# Patient Record
Sex: Male | Born: 1940 | Race: White | Hispanic: No | Marital: Married | Smoking: Former smoker
Health system: Southern US, Community
[De-identification: ages and names within clinical notes are randomized; demographics above are authoritative.]

## PROBLEM LIST (undated history)

## (undated) DIAGNOSIS — C61 Malignant neoplasm of prostate: Secondary | ICD-10-CM

## (undated) DIAGNOSIS — I219 Acute myocardial infarction, unspecified: Secondary | ICD-10-CM

## (undated) DIAGNOSIS — K219 Gastro-esophageal reflux disease without esophagitis: Secondary | ICD-10-CM

## (undated) DIAGNOSIS — I10 Essential (primary) hypertension: Secondary | ICD-10-CM

## (undated) DIAGNOSIS — I2510 Atherosclerotic heart disease of native coronary artery without angina pectoris: Secondary | ICD-10-CM

## (undated) DIAGNOSIS — G629 Polyneuropathy, unspecified: Secondary | ICD-10-CM

## (undated) DIAGNOSIS — E7800 Pure hypercholesterolemia, unspecified: Secondary | ICD-10-CM

## (undated) HISTORY — PX: CORONARY ARTERY BYPASS GRAFT: SHX141

## (undated) HISTORY — PX: CAROTID STENT: SHX1301

## (undated) HISTORY — PX: CATARACT EXTRACTION: SUR2

## (undated) HISTORY — DX: Malignant neoplasm of prostate: C61

## (undated) HISTORY — DX: Pure hypercholesterolemia, unspecified: E78.00

## (undated) HISTORY — DX: Essential (primary) hypertension: I10

## (undated) HISTORY — PX: UMBILICAL HERNIA REPAIR: SHX196

## (undated) HISTORY — PX: KNEE SURGERY: SHX244

## (undated) HISTORY — DX: Atherosclerotic heart disease of native coronary artery without angina pectoris: I25.10

## (undated) HISTORY — PX: CHOLECYSTECTOMY: SHX55

## (undated) HISTORY — PX: HERNIA REPAIR: SHX51

---

## 2004-01-17 ENCOUNTER — Other Ambulatory Visit: Payer: Self-pay

## 2004-05-21 ENCOUNTER — Emergency Department: Payer: Self-pay | Admitting: Emergency Medicine

## 2004-06-26 HISTORY — PX: KNEE SURGERY: SHX244

## 2004-07-01 ENCOUNTER — Ambulatory Visit: Payer: Self-pay | Admitting: Family Medicine

## 2004-07-19 ENCOUNTER — Ambulatory Visit: Payer: Self-pay | Admitting: Family Medicine

## 2004-07-27 ENCOUNTER — Ambulatory Visit: Payer: Self-pay | Admitting: Family Medicine

## 2005-01-13 ENCOUNTER — Ambulatory Visit: Payer: Self-pay | Admitting: Gastroenterology

## 2005-11-28 ENCOUNTER — Ambulatory Visit: Payer: Self-pay | Admitting: Family Medicine

## 2006-03-27 ENCOUNTER — Ambulatory Visit: Payer: Self-pay | Admitting: Family Medicine

## 2006-04-24 ENCOUNTER — Ambulatory Visit: Payer: Self-pay | Admitting: Family Medicine

## 2008-02-05 ENCOUNTER — Ambulatory Visit: Payer: Self-pay | Admitting: Gastroenterology

## 2008-02-09 ENCOUNTER — Other Ambulatory Visit: Payer: Self-pay

## 2008-02-10 ENCOUNTER — Inpatient Hospital Stay: Payer: Self-pay | Admitting: Internal Medicine

## 2008-02-27 ENCOUNTER — Ambulatory Visit: Payer: Self-pay | Admitting: Gastroenterology

## 2008-11-27 ENCOUNTER — Ambulatory Visit: Payer: Self-pay | Admitting: Family Medicine

## 2008-12-24 ENCOUNTER — Ambulatory Visit: Payer: Self-pay | Admitting: Family Medicine

## 2009-05-14 ENCOUNTER — Ambulatory Visit: Payer: Self-pay

## 2009-07-23 ENCOUNTER — Ambulatory Visit: Payer: Self-pay | Admitting: General Practice

## 2009-07-30 ENCOUNTER — Ambulatory Visit: Payer: Self-pay | Admitting: General Practice

## 2010-02-03 ENCOUNTER — Ambulatory Visit: Payer: Self-pay | Admitting: Urology

## 2010-04-20 ENCOUNTER — Encounter (HOSPITAL_COMMUNITY)
Admission: RE | Admit: 2010-04-20 | Discharge: 2010-06-24 | Payer: Self-pay | Source: Home / Self Care | Attending: Urology | Admitting: Urology

## 2010-08-30 ENCOUNTER — Ambulatory Visit: Payer: Self-pay | Admitting: Gastroenterology

## 2011-02-28 ENCOUNTER — Ambulatory Visit: Payer: Self-pay | Admitting: Cardiology

## 2011-03-07 ENCOUNTER — Inpatient Hospital Stay: Payer: Self-pay | Admitting: Internal Medicine

## 2011-04-26 ENCOUNTER — Ambulatory Visit: Payer: Self-pay | Admitting: Cardiology

## 2011-07-31 ENCOUNTER — Encounter: Payer: Self-pay | Admitting: Cardiology

## 2011-08-25 ENCOUNTER — Encounter: Payer: Self-pay | Admitting: Cardiology

## 2011-09-25 ENCOUNTER — Encounter: Payer: Self-pay | Admitting: Cardiology

## 2012-04-02 ENCOUNTER — Ambulatory Visit: Payer: Self-pay | Admitting: Urology

## 2012-06-14 ENCOUNTER — Ambulatory Visit: Payer: Self-pay | Admitting: Gastroenterology

## 2012-06-18 LAB — PATHOLOGY REPORT

## 2012-10-16 ENCOUNTER — Ambulatory Visit: Payer: Self-pay | Admitting: Urology

## 2012-10-17 ENCOUNTER — Ambulatory Visit: Payer: Self-pay | Admitting: Urology

## 2013-03-05 ENCOUNTER — Ambulatory Visit: Payer: Self-pay | Admitting: Ophthalmology

## 2013-03-07 ENCOUNTER — Ambulatory Visit: Payer: Self-pay | Admitting: Gastroenterology

## 2013-03-17 ENCOUNTER — Ambulatory Visit: Payer: Self-pay | Admitting: Ophthalmology

## 2013-06-09 ENCOUNTER — Ambulatory Visit: Payer: Self-pay | Admitting: Ophthalmology

## 2014-01-21 ENCOUNTER — Ambulatory Visit: Payer: Self-pay

## 2014-01-27 ENCOUNTER — Ambulatory Visit: Payer: Self-pay

## 2014-02-17 ENCOUNTER — Emergency Department: Payer: Self-pay | Admitting: Emergency Medicine

## 2014-03-21 ENCOUNTER — Emergency Department: Payer: Self-pay | Admitting: Student

## 2014-07-29 ENCOUNTER — Ambulatory Visit: Payer: Self-pay | Admitting: Internal Medicine

## 2014-08-07 ENCOUNTER — Ambulatory Visit: Payer: Self-pay | Admitting: Internal Medicine

## 2014-08-10 ENCOUNTER — Ambulatory Visit: Payer: Self-pay | Admitting: Radiation Oncology

## 2014-08-25 ENCOUNTER — Ambulatory Visit
Admit: 2014-08-25 | Disposition: A | Discharge status: Home / Self Care | Payer: Self-pay | Attending: Radiation Oncology | Admitting: Radiation Oncology

## 2014-09-23 LAB — CBC CANCER CENTER
BASOS PCT: 0.7 %
Basophil #: 0 x10 3/mm (ref 0.0–0.1)
EOS PCT: 3 %
Eosinophil #: 0.2 x10 3/mm (ref 0.0–0.7)
HCT: 39.2 % — ABNORMAL LOW (ref 40.0–52.0)
HGB: 13.2 g/dL (ref 13.0–18.0)
Lymphocyte #: 0.7 x10 3/mm — ABNORMAL LOW (ref 1.0–3.6)
Lymphocyte %: 12.1 %
MCH: 28.3 pg (ref 26.0–34.0)
MCHC: 33.6 g/dL (ref 32.0–36.0)
MCV: 84 fL (ref 80–100)
MONOS PCT: 10.7 %
Monocyte #: 0.6 x10 3/mm (ref 0.2–1.0)
NEUTROS PCT: 73.5 %
Neutrophil #: 3.9 x10 3/mm (ref 1.4–6.5)
PLATELETS: 153 x10 3/mm (ref 150–440)
RBC: 4.67 10*6/uL (ref 4.40–5.90)
RDW: 14.3 % (ref 11.5–14.5)
WBC: 5.4 x10 3/mm (ref 3.8–10.6)

## 2014-09-25 ENCOUNTER — Ambulatory Visit
Admit: 2014-09-25 | Disposition: A | Discharge status: Home / Self Care | Payer: Self-pay | Attending: Radiation Oncology | Admitting: Radiation Oncology

## 2014-10-16 NOTE — Op Note (Signed)
PATIENT NAME:  Derek Parker, Derek Parker MR#:  [id number] DATE OF BIRTH:  06/12/1941  DATE OF PROCEDURE:  06/09/2013  PREOPERATIVE DIAGNOSIS:  Cataract, right eye.   POSTOPERATIVE DIAGNOSIS:  Cataract, right eye.  PROCEDURE PERFORMED:  Extracapsular cataract extraction using phacoemulsification with placement of an Alcon SN6CWS, 17.5-diopter posterior chamber lens, serial [id number].  SURGEON:  Loura Back. Marquis Down, MD  ASSISTANT:  None.  ANESTHESIA:  4% lidocaine and 0.75% Marcaine in a 50/50 mixture with 10 units/mL of Hylenex added, given as a peribulbar.   ANESTHESIOLOGIST:  Dr. Myra Gianotti  LICATIONS:  None.  ESTIMATED BLOOD LOSS:  Less than 1 ml.  DESCRIPTION OF PROCEDURE:  The patient was brought to the operating room and given a peribulbar block.  The patient was then prepped and draped in the usual fashion.  The vertical rectus muscles were imbricated using 5-0 silk sutures.  These sutures were then clamped to the sterile drapes as bridle sutures.  A limbal peritomy was performed extending two clock hours and hemostasis was obtained with cautery.  A partial thickness scleral groove was made at the surgical limbus and dissected anteriorly in a lamellar dissection using an Alcon crescent knife.  The anterior chamber was entered superonasally with a Superblade and through the lamellar dissection with a 2.6 mm keratome.  DisCoVisc was used to replace the aqueous and a continuous tear capsulorrhexis was carried out.  Hydrodissection and hydrodelineation were carried out with balanced salt and a 27 gauge canula.  The nucleus was rotated to confirm the effectiveness of the hydrodissection.  Phacoemulsification was carried out using a divide-and-conquer technique.  Total ultrasound time was 1 minutes and average power of 20.9  percent and CDE of 23.26.  Irrigation/aspiration was used to remove the residual cortex.  DisCoVisc was used to inflate the capsule and the internal incision was enlarged to 3 mm  with the crescent knife.  The intraocular lens was folded and inserted into the capsular bag using the AcrySert delivery system.  Irrigation/aspiration was used to remove the residual DisCoVisc.  Miostat was injected into the anterior chamber through the paracentesis track to inflate the anterior chamber and induce miosis.  A tenth of a milliliter of cefuroxime was injected via the paracentesis tract containing 1 mg of drug. The wound was checked for leaks and none were found. The conjunctiva was closed with cautery and the bridle sutures were removed.  Two drops of 0.3% Vigamox were placed on the eye.   An eye shield was placed on the eye.  The patient was discharged to the recovery room in good condition. ____________________________ Loura Back Jaclene Bartelt, MD sad:sb D: 06/09/2013 12:40:33 ET T: 06/09/2013 13:26:13 ET JOB#: [id number]  cc: Remo Lipps A. Frans Valente, MD, <Dictator> Martie Lee MD ELECTRONICALLY SIGNED 06/23/2013 13:31

## 2014-10-16 NOTE — Op Note (Signed)
PATIENT NAME:  Derek Parker, Derek Parker MR#:  [id number] DATE OF BIRTH:  1941/02/02  DATE OF PROCEDURE:  03/17/2013  PREOPERATIVE DIAGNOSIS: Cataract, left eye.   POSTOPERATIVE DIAGNOSIS:  Cataract, left eye.    PROCEDURE PERFORMED: Extracapsular cataract extraction using phacoemulsification with placement Alcon SN6CWS, 18.0 diopter posterior chamber lens, serial number [id number].   SURGEON: Loura Back. Savier Trickett, M.D.   ANESTHESIA: Lidocaine 4% and 0.75% Marcaine 50-50 mixture with 10 units/mL of Hylenex added, given as a peribulbar.   ANESTHESIOLOGIST: Dr. Benjamine Mola.   COMPLICATIONS: None.   ESTIMATED BLOOD LOSS: Less than 1 mL.   DESCRIPTION OF PROCEDURE:  The patient was brought to the operating room and given a peribulbar block.  The patient was then prepped and draped in the usual fashion.  The vertical rectus muscles were imbricated using 5-0 silk sutures.  These sutures were then clamped to the sterile drapes as bridle sutures.  A limbal peritomy was performed extending 2 clock hours and hemostasis was obtained with cautery.  A partial thickness scleral groove was made at the surgical limbus and dissected anteriorly in a lamellar dissection using an Alcon crescent knife.  The anterior chamber was entered supero-temporally with a Superblade and through the lamellar dissection with a 2.6 mm keratome.  DisCoVisc was used to replace the aqueous and a continuous tear capsulorrhexis was carried out.  Hydrodissection and hydrodelineation were carried out with balanced salt and a 27-gauge canula.  The nucleus was rotated to confirm the effectiveness of the hydrodissection.  Phacoemulsification was carried out using a divide-and-conquer technique.  Total ultrasound time was 1 minute and 6 seconds with an average power of 22.4%, CDE of 26.75.  Irrigation/aspiration was used to remove the residual cortex.  DisCoVisc was used to inflate the capsule and the internal incision was enlarged to 3 mm with the  crescent knife.  The intraocular lens was folded and inserted into the capsular bag using the [employer].  Irrigation/aspiration was used to remove the residual DisCoVisc.  Miostat was injected into the anterior chamber through the paracentesis track to inflate the anterior chamber and induce miosis.  Cefuroxime 0.1 mL containing 1 mg of the drug was injected via the paracentesis track.  The wound was checked for leaks and wound leakage was found.  A single 10-0 suture was placed across the incision, tied and the knot was rotated superiorly.  The conjunctiva was closed with cautery and the bridle sutures were removed.  Two drops of 0.3% Vigamox were placed on the eye.   An eye shield was placed on the eye.  The patient was discharged to the recovery room in good condition.    ____________________________ Loura Back Sheetal Lyall, MD sad:cs D: 03/17/2013 13:13:42 ET T: 03/17/2013 14:03:06 ET JOB#: [id number]  cc: Remo Lipps A. Jelina Paulsen, MD, <Dictator> Martie Lee MD ELECTRONICALLY SIGNED 03/24/2013 13:20

## 2014-10-19 LAB — CBC CANCER CENTER
BASOS PCT: 0.8 %
Basophil #: 0 x10 3/mm (ref 0.0–0.1)
Eosinophil #: 0.2 x10 3/mm (ref 0.0–0.7)
Eosinophil %: 3 %
HCT: 40.9 % (ref 40.0–52.0)
HGB: 13.8 g/dL (ref 13.0–18.0)
LYMPHS ABS: 0.9 x10 3/mm — AB (ref 1.0–3.6)
Lymphocyte %: 15.3 %
MCH: 28.3 pg (ref 26.0–34.0)
MCHC: 33.7 g/dL (ref 32.0–36.0)
MCV: 84 fL (ref 80–100)
MONO ABS: 0.5 x10 3/mm (ref 0.2–1.0)
Monocyte %: 7.9 %
NEUTROS ABS: 4.3 x10 3/mm (ref 1.4–6.5)
NEUTROS PCT: 73 %
Platelet: 170 x10 3/mm (ref 150–440)
RBC: 4.88 10*6/uL (ref 4.40–5.90)
RDW: 14.9 % — AB (ref 11.5–14.5)
WBC: 5.8 x10 3/mm (ref 3.8–10.6)

## 2014-10-25 NOTE — Consult Note (Signed)
Reason for Visit: This 74 year old Male patient presents to the clinic for initial evaluation of  stage IV prostate cancer .   Referred by Dr. Elza Rafter [facility].  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 Metastatic workup was negative he underwent Lupron therapy along with concurrent radiation therapy to his prostate. In 2014 his PSA progressed and 94 PET scan with sodium fluoride demonstrated widespread osseous metastatic disease involving spine ribs and pelvis. Interestingly this was not seen on bone scan. He has been treated with Zytiga and prednisone as well as Xtandi,Xgeva. Recent PET CT scan showed again widespread axial metastasis with a new lesion in his left mid femoral shaft and he has been referred for palliative radiation therapy here. He is ambulating well. States he's not really having that much significant pain. He continues to work as a Presenter, broadcasting at [employer].  Past Hx:    prostate cancer metastatic to bone: will start XRT to L femur 07/2014   Hypercholesterolemia:    Cancer, Prostate:    CAD:    diabetic neuropathy:    Diabetes:    Hypertension:    CABG:    umbilical hernia repair:    Cholecystectomy:    Hernia Repair:    Knee Surgery - Left:    Carotid Stent Placement:   Past, Family and Social History:  Past Medical History positive   Cardiovascular  coronary artery disease; coronary artery stents; hyperlipidemia; hypertension   Endocrine diabetes mellitus; diabetic neuropathy   Past Surgical History cholecystectomy; left knee surgery, herniorrhaphy repair, umbilical hernia repair   Family History positive   Family History Comments father with prostate cancer, congestive heart failure   Social History positive   Social History Comments 40-pack-year smoking history has quit smoking, social EtOH use history   Additional Past Medical and Surgical History accompanied by his wife today   Allergies:   No Known Allergies:   Home Meds:  Home Medications: Medication Instructions Status  doxycycline hyclate 100 mg capsule 1 cap(s) orally 2 times a day x 10 days Active  Crestor 20 mg oral tablet 1 tab(s)  once a day (at bedtime)  Active  Plavix 75 mg oral tablet 1 tab(s)  once a day (at bedtime) Discontinue prior to surgery   Active  aspirin 81mg  1 tab(s)  once a day (at bedtime) Discontinue prior to surgery   Active  Niaspan ER 500 mg oral tablet, extended release 3 tab(s)  once a day (at bedtime)  Active  Calcium 600+D 600 mg-200 intl units oral tablet 1 tab(s) orally 2 times a day Active  multivitamin 1  orally once a day Active  Dexilant 60 mg oral delayed release capsule 1 cap(s) orally once a day Active  alfuzosin 10 mg oral tablet, extended release 1 tab(s) orally once a day Active  metoprolol tartrate 100 mg oral tablet 1.5 tab(s) orally once a day Active  venlafaxine extended release 75 mg  oral capsule, extended release 3 cap(s) orally 3 times a day Active  solifenacin 5 mg oral tablet 1 tab(s) orally once a day Active  acetaminophen-oxyCODONE 325 mg-5 mg oral tablet 1 tab(s) orally every 6 hours, As Needed Active  Effexor XR 75 mg oral capsule, extended release 3 cap(s) orally once a day Active   Review of Systems:  General negative   Performance Status (ECOG) 0   Skin negative   Breast negative   Ophthalmologic  negative   ENMT negative   Respiratory and Thorax negative   Cardiovascular negative   Gastrointestinal negative   Genitourinary see HPI   Musculoskeletal negative   Neurological negative   Psychiatric negative   Hematology/Lymphatics negative   Endocrine negative   Allergic/Immunologic negative   Review of Systems   denies any weight loss, fatigue, weakness, fever, chills or night sweats. Patient denies any loss of vision, blurred vision. Patient denies any ringing  of the ears or hearing loss. No irregular heartbeat. Patient denies heart murmur or history of fainting. Patient denies any chest pain or pain radiating to her upper extremities. Patient denies any shortness of breath, difficulty breathing at night, cough or hemoptysis. Patient denies any swelling in the lower legs. Patient denies any nausea vomiting, vomiting of blood, or coffee ground material in the vomitus. Patient denies any stomach pain. Patient states has had normal bowel movements no significant constipation or diarrhea. Patient denies any dysuria, hematuria or significant nocturia. Patient denies any problems walking, swelling in the joints or loss of balance. Patient denies any skin changes, loss of hair or loss of weight. Patient denies any excessive worrying or anxiety or significant depression. Patient denies any problems with insomnia. Patient denies excessive thirst, polyuria, polydipsia. Patient denies any swollen glands, patient denies easy bruising or easy bleeding. Patient denies any recent infections, allergies or URI. Patient "s visual fields have not changed significantly in recent time.   Nursing Notes:  Nursing Vital Signs and Chemo Nursing Nursing Notes: *CC Vital Signs Flowsheet:   15-Feb-16 09:14  Temp Temperature 95.8  Pulse Pulse 67  Respirations Respirations 18  SBP SBP 132  DBP DBP 69  Pain Scale (0-10)  0  Current Weight (kg) (kg) 105.9   Physical Exam:  General/Skin/HEENT:   General normal   Skin normal   Eyes normal   ENMT normal   Head and Neck normal   Additional PE well-developed slightly obese male in NAD. Lungs are clear to A&P cardiac examination shows regular rate and rhythm abdomen is benign. Motor sensory and DTR levels are equal and symmetric in the upper lower extremities. Deep palpation of the spine does not elicit pain. Range of motion of his lower extremities does not elicit pain. Deep palpation of his femoral shafts bilaterally does not elicit pain. Per perception is intact.   Breasts/Resp/CV/GI/GU:  Respiratory and Thorax normal   Cardiovascular normal   Gastrointestinal normal   Genitourinary normal   MS/Neuro/Psych/Lymph:  Musculoskeletal normal   Neurological normal   Lymphatics normal   Relevent Results:   Relevant Scans and Labs recent PET CT scan is reviewed   Assessment and Plan: Impression:   progressive widespread metastatic prostate cancer in castrate resistant adenocarcinoma in 74 year old male with new lesion in the left femoral shaft. Plan:   at this time to go ahead with palliative radiation therapy to his left femur all include his acetabulum and proximal femur as well as the lesion in his mid shaft entry to 3000 cGy  in 10 fractions. Risks and benefits of treatment including some skin reaction fatigue were discussed in detail. I've also discussed with [facility] startingXofigo treatment 6 injections of radium 223area risks and benefits of 6 injections including bone marrow suppression, nausea diarrhea peripheral edema and injection site reaction all were discussed in detail with the patient. Will begin preparation for radium 223 treatments after completion of his of his external beam radiation. Patient and his wife both seem to comprehend my treatment plan well.I have set up and ordered CT simulation of his left femur for later this week.  I would like to take this opportunity for allowing me to participate in the care of  your patient..  CC Referral:  cc: Dr. Elza Rafter [facility]   Fax to Physician:  Physicians To Recieve Fax: Woodfin Ganja, MD - [phone].  Electronic Signatures: Hollis Oh, Roda Shutters (MD)  (Signed 15-Feb-16 11:32)  Authored: HPI, Diagnosis, Past Hx, PFSH, Allergies, Home Meds, ROS, Nursing Notes, Physical Exam, Relevent Results, Encounter Assessment and Plan, CC Referring Physician, Fax to Physician   Last Updated: 15-Feb-16 11:32 by [name] (MD)

## 2014-10-26 ENCOUNTER — Ambulatory Visit: Payer: Self-pay | Admitting: Radiation Oncology

## 2014-10-28 ENCOUNTER — Ambulatory Visit
Admission: RE | Admit: 2014-10-28 | Discharge: 2014-10-28 | Disposition: A | Discharge status: Home / Self Care | Payer: Medicare Other | Source: Ambulatory Visit | Attending: Radiation Oncology | Admitting: Radiation Oncology

## 2014-10-28 ENCOUNTER — Encounter: Payer: Self-pay | Admitting: Radiation Oncology

## 2014-10-28 ENCOUNTER — Other Ambulatory Visit: Payer: Self-pay | Admitting: *Deleted

## 2014-10-28 VITALS — BP 152/80 | HR 68 | Temp 96.9°F | Resp 18 | Wt 231.7 lb

## 2014-10-28 DIAGNOSIS — Z510 Encounter for antineoplastic radiation therapy: Secondary | ICD-10-CM | POA: Diagnosis present

## 2014-10-28 DIAGNOSIS — C61 Malignant neoplasm of prostate: Secondary | ICD-10-CM | POA: Diagnosis not present

## 2014-10-28 DIAGNOSIS — C7951 Secondary malignant neoplasm of bone: Secondary | ICD-10-CM | POA: Diagnosis not present

## 2014-10-28 DIAGNOSIS — C7952 Secondary malignant neoplasm of bone marrow: Principal | ICD-10-CM

## 2014-10-28 NOTE — Evaluation (Signed)
Radiation Oncology Follow up Note  Name: Derek Parker   Date:   10/28/2014 MRN:  [id number] DOB: 25-Dec-1940    This 74 y.o. male presents to the clinic today for treatment follow up. And injection of Xofigo  REFERRING PROVIDER: No ref. provider found  HPI: patient is a 74 year old male treated back in 2002 for a Gleason 8 (4+4) adenocarcinoma the prostate presenting with a PSA of 8 status post Lupron therapy as well as IM RT radiation therapy. 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 He has been treated with his Zytiga , prednisone,Xtandi,Xgeva. He is seen today for Xofigo injection. Weight is stable patient has no specific complaints. Has had excellent palliative result from prior radiation therapy to his hip..  COMPLICATIONS OF TREATMENT: none  FOLLOW UP COMPLIANCE: keeps appointments   PHYSICAL EXAM:  BP 152/80 mmHg  Pulse 68  Temp(Src) 96.9 F (36.1 C)  Resp 18  Wt 231 lb 11.3 oz (105.1 kg) Well-developed well-nourished patient in NAD. HEENT reveals PERLA, EOMI, discs not visualized.  Oral cavity is clear. No oral mucosal lesions are identified. Neck is clear without evidence of cervical or supraclavicular adenopathy. Lungs are clear to A&P. Cardiac examination is essentially unremarkable with regular rate and rhythm without murmur rub or thrill. Abdomen is benign with no organomegaly or masses noted. Motor sensory and DTR levels are equal and symmetric in the upper and lower extremities. Cranial nerves II through XII are grossly intact. Proprioception is intact. No peripheral adenopathy or edema is identified. No motor or sensory levels are noted. Crude visual fields are within normal range.   RADIOLOGY RESULTS: No radiologic results to report  PLAN: Peripheral line was started on the patient and 20 cc of normal saline were passed to make sure there was patency of the lines. [name] was  administered over a 10 minute infusion push by radiation oncologist. After completion of IV push of Xofigo 30 cc an additional saline were passed through the peripheral line. Oral lines syringes drapes and original container of Xofigo were then taken to nuclear medicine for storage. Patient tolerated the procedure well without side effect or complaint. Patient has anti-emetic medication. Have scheduled the patient for a three-week followup to check on his counts. Patient is to call sooner with any side effects or complaints.     [name]., MD

## 2014-10-30 ENCOUNTER — Emergency Department: Payer: Medicare Other

## 2014-10-30 ENCOUNTER — Other Ambulatory Visit: Payer: Self-pay

## 2014-10-30 ENCOUNTER — Encounter: Payer: Self-pay | Admitting: *Deleted

## 2014-10-30 ENCOUNTER — Emergency Department
Admission: EM | Admit: 2014-10-30 | Discharge: 2014-10-30 | Disposition: A | Discharge status: Home / Self Care | Payer: Medicare Other | Attending: Emergency Medicine | Admitting: Emergency Medicine

## 2014-10-30 DIAGNOSIS — Z7902 Long term (current) use of antithrombotics/antiplatelets: Secondary | ICD-10-CM | POA: Diagnosis not present

## 2014-10-30 DIAGNOSIS — R079 Chest pain, unspecified: Secondary | ICD-10-CM | POA: Diagnosis present

## 2014-10-30 DIAGNOSIS — Z7982 Long term (current) use of aspirin: Secondary | ICD-10-CM | POA: Insufficient documentation

## 2014-10-30 DIAGNOSIS — R0789 Other chest pain: Secondary | ICD-10-CM | POA: Diagnosis not present

## 2014-10-30 DIAGNOSIS — Z79899 Other long term (current) drug therapy: Secondary | ICD-10-CM | POA: Diagnosis not present

## 2014-10-30 DIAGNOSIS — I10 Essential (primary) hypertension: Secondary | ICD-10-CM | POA: Diagnosis not present

## 2014-10-30 DIAGNOSIS — E119 Type 2 diabetes mellitus without complications: Secondary | ICD-10-CM | POA: Insufficient documentation

## 2014-10-30 DIAGNOSIS — Z87891 Personal history of nicotine dependence: Secondary | ICD-10-CM | POA: Diagnosis not present

## 2014-10-30 LAB — CBC
HCT: 37.5 % — ABNORMAL LOW (ref 40.0–52.0)
Hemoglobin: 12.4 g/dL — ABNORMAL LOW (ref 13.0–18.0)
MCH: 27.9 pg (ref 26.0–34.0)
MCHC: 33 g/dL (ref 32.0–36.0)
MCV: 84.5 fL (ref 80.0–100.0)
PLATELETS: 160 10*3/uL (ref 150–440)
RBC: 4.44 MIL/uL (ref 4.40–5.90)
RDW: 14.6 % — AB (ref 11.5–14.5)
WBC: 5.4 10*3/uL (ref 3.8–10.6)

## 2014-10-30 LAB — BASIC METABOLIC PANEL
ANION GAP: 9 (ref 5–15)
BUN: 28 mg/dL — AB (ref 6–20)
CHLORIDE: 108 mmol/L (ref 101–111)
CO2: 22 mmol/L (ref 22–32)
CREATININE: 1.19 mg/dL (ref 0.61–1.24)
Calcium: 9.1 mg/dL (ref 8.9–10.3)
GFR calc Af Amer: >60 mL/min (ref >60)
GFR calc non Af Amer: 59 mL/min — ABNORMAL LOW (ref >60)
GLUCOSE: 134 mg/dL — AB (ref 65–99)
Potassium: 4.1 mmol/L (ref 3.5–5.1)
Sodium: 139 mmol/L (ref 135–145)

## 2014-10-30 LAB — TROPONIN I

## 2014-10-30 MED ORDER — ASPIRIN 81 MG PO CHEW
CHEWABLE_TABLET | ORAL | Status: AC
  Administered 2014-10-30: 324 mg via ORAL
  Filled 2014-10-30: qty 4

## 2014-10-30 MED ORDER — ASPIRIN 81 MG PO CHEW
324.0000 mg | CHEWABLE_TABLET | Freq: Once | ORAL | Status: AC
  Administered 2014-10-30: 324 mg via ORAL

## 2014-10-30 NOTE — ED Provider Notes (Signed)
  Physical Exam  BP 121/69 mmHg  Pulse 69  Temp(Src) 98.3 F (36.8 C) (Oral)  Resp 19  SpO2 96%  Patient with chest pain presents today with nonspecific EKG changes. Per Dr. Reita Cliche she discussed with the patient who did not want to stay for chest pain observation. The patient has a follow point with his cardiologist, Dr.Paraschos, this Monday and second troponin is negative will be discharged. Physical Exam ----------------------------------------- 5:25 PM on 10/30/2014 -----------------------------------------   ED Course  Procedures  MDM Patient is resting comfortably without chest pain at this time. Says "I feel great."  We'll discharge to home.  Has follow-up appointment Monday morning with Dr. Saralyn Pilar, his cardiologist      Doran Stabler, MD 10/30/14 1726

## 2014-10-30 NOTE — ED Notes (Signed)
Pt experienced a sudden onset of left sided chest pain with dizziness today, pt rates pain 0 in traige

## 2014-10-30 NOTE — ED Notes (Signed)
Pt informed to return if life threatening symptoms occur.   

## 2014-10-30 NOTE — ED Provider Notes (Signed)
[facility] Emergency Department Provider Note   ____________________________________________  Time seen: On arrival  I have reviewed the triage vital signs and the nursing notes.   HISTORY  Chief Complaint Chest Pain    HPI Derek Parker is a 74 y.o. male who complains of left-sided chest pain. It started out just prior to arrival last about 30 minutes. Severity was mild to moderate. It did not feel like when he had a previous MI. He had no shortness of breath, nausea, or sweating. He has not been sick recently      Past Medical History  Diagnosis Date  . Prostate cancer     metastatic to bone  . Hypercholesteremia   . CAD (coronary artery disease)   . Neuropathy, diabetic   . Diabetes mellitus without complication   . HTN (hypertension)     There are no active problems to display for this patient.   Past Surgical History  Procedure Laterality Date  . Coronary artery bypass graft    . Umbilical hernia repair    . Cholecystectomy    . Knee surgery Left   . Carotid stent    . Knee surgery Left 2006    Current Outpatient Rx  Name  Route  Sig  Dispense  Refill  . alfuzosin (UROXATRAL) 10 MG 24 hr tablet   Oral   Take 1 tablet by mouth daily.         Marland Kitchen aspirin EC 81 MG tablet   Oral   Take 81 mg by mouth daily.         . Calcium Carb-Cholecalciferol (CALCIUM + D3) 600-200 MG-UNIT TABS   Oral   Take 1 tablet by mouth 2 (two) times daily before a meal.         . Cholecalciferol (VITAMIN D3) 2000 UNITS capsule   Oral   Take 2,000 Units by mouth daily.          . clopidogrel (PLAVIX) 75 MG tablet   Oral   Take 75 mg by mouth daily.         Marland Kitchen dexlansoprazole (DEXILANT) 60 MG capsule   Oral   Take 1 capsule by mouth daily.         . metoprolol (LOPRESSOR) 100 MG tablet   Oral   Take 150 mg by mouth daily.          . Multiple Vitamins-Minerals (MULTIVITAMIN ADULT PO)   Oral   Take 1 tablet by mouth daily.          . niacin (NIASPAN) 500 MG CR tablet   Oral   Take 1,500 mg by mouth at bedtime.         . Radium Ra 223 Dichloride (XOFIGO IV)   Intravenous   Inject 2 Tubes into the vein every 30 (thirty) days. Pt gets IV therapy every month on the 4th         . rosuvastatin (CRESTOR) 20 MG tablet   Oral   Take 20 mg by mouth daily.         . solifenacin (VESICARE) 5 MG tablet   Oral   Take 1 tablet by mouth daily.         Marland Kitchen venlafaxine XR (EFFEXOR-XR) 75 MG 24 hr capsule   Oral   Take 3 capsules by mouth daily.         Marland Kitchen abiraterone Acetate (ZYTIGA) 250 MG tablet   Oral   Take 250 mg by mouth.  Allergies No known allergies  Family History  Problem Relation Age of Onset  . Cancer Father     prostate    Social History History  Substance Use Topics  . Smoking status: Former Smoker -- 40 years  . Smokeless tobacco: Not on file  . Alcohol Use: 0.6 oz/week    1 Shots of liquor per week    Review of Systems  Constitutional: Negative for fever. Eyes: Negative for visual changes. ENT: Negative for sore throat. Cardiovascular: Negative for palpitations Respiratory: Negative for shortness of breath. Gastrointestinal: Negative for abdominal pain, vomiting and diarrhea. Genitourinary: Negative for dysuria. Musculoskeletal: Negative for back pain. Skin: Negative for rash. Neurological: Negative for headaches, focal weakness or numbness.   10-point ROS otherwise negative.  ____________________________________________   PHYSICAL EXAM:  VITAL SIGNS: ED Triage Vitals  Enc Vitals Group     BP 10/30/14 1122 83/54 mmHg     Pulse Rate 10/30/14 1122 62     Resp 10/30/14 1200 23     Temp 10/30/14 1122 98.3 F (36.8 C)     Temp Source 10/30/14 1122 Oral     SpO2 10/30/14 1122 98 %     Weight --      Height --      Head Cir --      Peak Flow --      Pain Score 10/30/14 1122 0     Pain Loc --      Pain Edu? --      Excl. in [place]? --       Constitutional: Alert and oriented. Well appearing and in no distress. Eyes: Conjunctivae are normal. PERRL. Normal extraocular movements. ENT   Head: Normocephalic and atraumatic.   Nose: No congestion/rhinnorhea.   Mouth/Throat: Mucous membranes are moist.   Neck: No stridor. Cardiovascular: Normal rate, regular rhythm.  No murmurs, rubs, or gallops. Respiratory: Normal respiratory effort without tachypnea nor retractions. Breath sounds are clear and equal bilaterally. No wheezes/rales/rhonchi. Gastrointestinal: Soft and nontender. No distention.  Genitourinary:  Musculoskeletal: Nontender with normal range of motion in all extremities. No joint effusions.  No lower extremity tenderness nor edema. Neurologic:  Normal speech and language. No gross focal neurologic deficits are appreciated. Speech is normal. No gait instability. Skin:  Skin is warm, dry and intact. No rash noted. Psychiatric: Mood and affect are normal. Speech and behavior are normal. Patient exhibits appropriate insight and judgment.  ____________________________________________    LABS (pertinent positives/negatives)  Troponin negative hemoglobin 35.4 metabolic panel pending  ____________________________________________   EKG  Normal sinus rhythm 64 bpm narrow QRS normal axis. T-wave inversions 3 and V1 through V4. This is a change from September 2014  EKG #2 repeat 4 hours later. Normal sinus rhythm 69 bpm narrow QRS normal axis T-wave inversions 3 V1 through V4. Unchanged from earlier today.  ____________________________________________    RADIOLOGY  Chest x-ray reviewed no edema or consolidation.  ____________________________________________   PROCEDURES  Procedure(s) performed: None  Critical Care performed: No  ____________________________________________   INITIAL IMPRESSION / ASSESSMENT AND PLAN / ED COURSE  Pertinent labs & imaging results that were available during  my care of the patient were reviewed by me and considered in my medical decision making (see chart for details).  The patient is currently chest pain free after an episode of nontoxic chest pain without associated symptoms. However he does have a history of a heart attack and an EKG which is changed from 2014. I will try to obtain an  EKG from cardiologist office. I discussed with the patient the option of four-hour repeat enzyme given this was very atypical chest pain and possibly going home versus hospitalization overnight. Patient wants to go home and understands the risks and return depressions. Patient care transferred to Dr. Dineen Kid 4 PM Appointment made with Dr. Lenn Cal for Monday 10:15am ____________________________________________   FINAL CLINICAL IMPRESSION(S) / ED DIAGNOSES  Acute chest pain nonspecific    Lisa Roca, MD 10/30/14 [phone]

## 2014-10-30 NOTE — Discharge Instructions (Signed)
We discussed her evaluation is reassuring in the emergency department. Return to ER for new or worsening chest pain, shortness of breath, trouble breathing, weakness, numbness, sweating episodes, or any other symptoms concerning to you. You need to see your cardiologist on Monday call for appointment.  Chest Pain (Nonspecific) It is often hard to give a specific diagnosis for the cause of chest pain. There is always a chance that your pain could be related to something serious, such as a heart attack or a blood clot in the lungs. You need to follow up with your health care provider for further evaluation. CAUSES   Heartburn.  Pneumonia or bronchitis.  Anxiety or stress.  Inflammation around your heart (pericarditis) or lung (pleuritis or pleurisy).  A blood clot in the lung.  A collapsed lung (pneumothorax). It can develop suddenly on its own (spontaneous pneumothorax) or from trauma to the chest.  Shingles infection (herpes zoster virus). The chest wall is composed of bones, muscles, and cartilage. Any of these can be the source of the pain.  The bones can be bruised by injury.  The muscles or cartilage can be strained by coughing or overwork.  The cartilage can be affected by inflammation and become sore (costochondritis). DIAGNOSIS  Lab tests or other studies may be needed to find the cause of your pain. Your health care provider may have you take a test called an ambulatory electrocardiogram (ECG). An ECG records your heartbeat patterns over a 24-hour period. You may also have other tests, such as:  Transthoracic echocardiogram (TTE). During echocardiography, sound waves are used to evaluate how blood flows through your heart.  Transesophageal echocardiogram (TEE).  Cardiac monitoring. This allows your health care provider to monitor your heart rate and rhythm in real time.  Holter monitor. This is a portable device that records your heartbeat and can help diagnose heart  arrhythmias. It allows your health care provider to track your heart activity for several days, if needed.  Stress tests by exercise or by giving medicine that makes the heart beat faster. TREATMENT   Treatment depends on what may be causing your chest pain. Treatment may include:  Acid blockers for heartburn.  Anti-inflammatory medicine.  Pain medicine for inflammatory conditions.  Antibiotics if an infection is present.  You may be advised to change lifestyle habits. This includes stopping smoking and avoiding alcohol, caffeine, and chocolate.  You may be advised to keep your head raised (elevated) when sleeping. This reduces the chance of acid going backward from your stomach into your esophagus. Most of the time, nonspecific chest pain will improve within 2-3 days with rest and mild pain medicine.  HOME CARE INSTRUCTIONS   If antibiotics were prescribed, take them as directed. Finish them even if you start to feel better.  For the next few days, avoid physical activities that bring on chest pain. Continue physical activities as directed.  Do not use any tobacco products, including cigarettes, chewing tobacco, or electronic cigarettes.  Avoid drinking alcohol.  Only take medicine as directed by your health care provider.  Follow your health care provider's suggestions for further testing if your chest pain does not go away.  Keep any follow-up appointments you made. If you do not go to an appointment, you could develop lasting (chronic) problems with pain. If there is any problem keeping an appointment, call to reschedule. SEEK MEDICAL CARE IF:   Your chest pain does not go away, even after treatment.  You have a rash with blisters  on your chest.  You have a fever. SEEK IMMEDIATE MEDICAL CARE IF:   You have increased chest pain or pain that spreads to your arm, neck, jaw, back, or abdomen.  You have shortness of breath.  You have an increasing cough, or you cough up  blood.  You have severe back or abdominal pain.  You feel nauseous or vomit.  You have severe weakness.  You faint.  You have chills. This is an emergency. Do not wait to see if the pain will go away. Get medical help at once. Call your local emergency services (911 in [place] Do not drive yourself to the hospital. MAKE SURE YOU:   Understand these instructions.  Will watch your condition.  Will get help right away if you are not doing well or get worse. Document Released: 03/22/2005 Document Revised: 06/17/2013 Document Reviewed: 01/16/2008 [facility] Patient Information 2015 [place], [place]. This information is not intended to replace advice given to you by your health care provider. Make sure you discuss any questions you have with your health care provider.

## 2014-11-16 ENCOUNTER — Inpatient Hospital Stay: Payer: Medicare Other | Attending: Radiation Oncology

## 2014-11-16 ENCOUNTER — Other Ambulatory Visit: Payer: Self-pay | Admitting: *Deleted

## 2014-11-16 DIAGNOSIS — C61 Malignant neoplasm of prostate: Secondary | ICD-10-CM | POA: Insufficient documentation

## 2014-11-16 DIAGNOSIS — C7951 Secondary malignant neoplasm of bone: Secondary | ICD-10-CM

## 2014-11-16 DIAGNOSIS — C7952 Secondary malignant neoplasm of bone marrow: Secondary | ICD-10-CM

## 2014-11-16 LAB — CBC
HCT: 39.4 % — ABNORMAL LOW (ref 40.0–52.0)
Hemoglobin: 13.4 g/dL (ref 13.0–18.0)
MCH: 28.6 pg (ref 26.0–34.0)
MCHC: 33.9 g/dL (ref 32.0–36.0)
MCV: 84.3 fL (ref 80.0–100.0)
PLATELETS: 187 10*3/uL (ref 150–440)
RBC: 4.68 MIL/uL (ref 4.40–5.90)
RDW: 14.7 % — AB (ref 11.5–14.5)
WBC: 5 10*3/uL (ref 3.8–10.6)

## 2014-11-16 LAB — DIFFERENTIAL
BASOS PCT: 1 %
Basophils Absolute: 0 10*3/uL (ref 0–0.1)
Eosinophils Absolute: 0.1 10*3/uL (ref 0–0.7)
Eosinophils Relative: 2 %
Lymphocytes Relative: 17 %
Lymphs Abs: 0.8 10*3/uL — ABNORMAL LOW (ref 1.0–3.6)
MONO ABS: 0.5 10*3/uL (ref 0.2–1.0)
Monocytes Relative: 10 %
Neutro Abs: 3.5 10*3/uL (ref 1.4–6.5)
Neutrophils Relative %: 70 %

## 2014-11-25 ENCOUNTER — Encounter: Payer: Self-pay | Admitting: Radiation Oncology

## 2014-11-25 ENCOUNTER — Ambulatory Visit
Admission: RE | Admit: 2014-11-25 | Discharge: 2014-11-25 | Disposition: A | Discharge status: Home / Self Care | Payer: Medicare Other | Source: Ambulatory Visit | Attending: Radiation Oncology | Admitting: Radiation Oncology

## 2014-11-25 ENCOUNTER — Other Ambulatory Visit: Payer: Self-pay | Admitting: *Deleted

## 2014-11-25 VITALS — BP 131/71 | HR 88 | Temp 97.4°F | Resp 20 | Ht 72.0 in | Wt 235.5 lb

## 2014-11-25 DIAGNOSIS — C7952 Secondary malignant neoplasm of bone marrow: Principal | ICD-10-CM

## 2014-11-25 DIAGNOSIS — Z510 Encounter for antineoplastic radiation therapy: Secondary | ICD-10-CM | POA: Diagnosis not present

## 2014-11-25 DIAGNOSIS — C7951 Secondary malignant neoplasm of bone: Secondary | ICD-10-CM

## 2014-11-25 DIAGNOSIS — C61 Malignant neoplasm of prostate: Secondary | ICD-10-CM

## 2014-11-25 NOTE — Progress Notes (Signed)
Radiation Oncology Procedure Note  Name: Derek Parker  MRN: [id number]  Date:   11/25/2014     DOB: 11-11-1940   This 74 y.o. male patient presents to the clinic for Derek Parker treatment.  CHIEF COMPLAINT:  Chief Complaint  Patient presents with  . Follow-up    Pt is here for xofigo #2    DIAGNOSIS: The encounter diagnosis was Malignant neoplasm of prostate.  HPI: Patient is a 74 year old male with known stage IV adenocarcinoma the prostate originally presenting with a Gleason 8 (4+4) and a PSA in the 8 range. He completed radiation therapy to his prostate in 2015 although soon presented with widespread bony metastasis. He has been treated with multiple systemic therapies including's Zytiga and prednisone as well as Xtandi,Xgeva. He is seen today for his second Xofigo treatment. He is done well since the first treatment. No side effects or complaints. PHYSICAL EXAM: Well-developed well-nourished patient in NAD. HEENT reveals PERLA, EOMI, discs not visualized.  Oral cavity is clear. No oral mucosal lesions are identified. Neck is clear without evidence of cervical or supraclavicular adenopathy. Lungs are clear to A&P. Cardiac examination is essentially unremarkable with regular rate and rhythm without murmur rub or thrill. Abdomen is benign with no organomegaly or masses noted. Motor sensory and DTR levels are equal and symmetric in the upper and lower extremities. Cranial nerves II through XII are grossly intact. Proprioception is intact. No peripheral adenopathy or edema is identified. No motor or sensory levels are noted. Crude visual fields are within normal range.   BP Readings from Last 1 Encounters:  10/30/14 143/83   Pulse Readings from Last 1 Encounters:  10/30/14 69   Wt Readings from Last 1 Encounters:  No data found for Wt     PROCEDURE: Peripheral line was started on the patient and 20 cc of normal saline were passed to make sure there was patency of the lines. [name] was  administered over a 10 minute infusion push by radiation oncologist. After completion of IV push of Xofigo 30 cc an additional saline were passed through the peripheral line. Oral lines syringes drapes and original container of Xofigo were then taken to nuclear medicine for storage. Patient tolerated the procedure well without side effect or complaint. Patient has anti-emetic medication. Have scheduled the patient for a three-week followup to check on his counts. Patient is to call sooner with any side effects or complaints.   PLAN: At the present time he continues to do well. Will see him back in 3 weeks to recheck his blood counts and schedule his next infusion. Lungs were clear to A&P after after completion of infusion. Patient knows to call sooner with any concerns.  I would like to take this opportunity for allowing me to participate in the care of your patient.[name]., MD

## 2014-11-25 NOTE — Addendum Note (Signed)
Encounter addended by: Noreene Filbert, MD on: 11/25/2014 11:31 AM<BR>     Documentation filed: Follow-up Section, LOS Section, Clinical Notes

## 2014-11-25 NOTE — Addendum Note (Signed)
Encounter addended by: Daiva Huge, RN on: 11/25/2014 10:00 AM<BR>     Documentation filed: Lines[employer]Drains[employer] Education officer, museum

## 2014-12-21 ENCOUNTER — Inpatient Hospital Stay: Payer: Medicare Other | Attending: Radiation Oncology

## 2014-12-22 ENCOUNTER — Telehealth: Payer: Self-pay | Admitting: *Deleted

## 2014-12-22 NOTE — Telephone Encounter (Signed)
Spoke with wife regarding the change in appointment times for Lab and injection.  Lab 01-11-15 @ 945am and Injection 01-20-15 @ 1100am.

## 2014-12-30 ENCOUNTER — Ambulatory Visit: Payer: Medicare Other | Admitting: Radiation Oncology

## 2015-01-11 ENCOUNTER — Inpatient Hospital Stay: Payer: Medicare Other | Attending: Radiation Oncology

## 2015-01-11 DIAGNOSIS — C61 Malignant neoplasm of prostate: Secondary | ICD-10-CM | POA: Diagnosis not present

## 2015-01-11 DIAGNOSIS — C7951 Secondary malignant neoplasm of bone: Secondary | ICD-10-CM | POA: Diagnosis not present

## 2015-01-11 DIAGNOSIS — C7952 Secondary malignant neoplasm of bone marrow: Secondary | ICD-10-CM

## 2015-01-11 LAB — CBC WITH DIFFERENTIAL/PLATELET
BASOS PCT: 1 %
Basophils Absolute: 0.1 10*3/uL (ref 0–0.1)
Eosinophils Absolute: 0.2 10*3/uL (ref 0–0.7)
Eosinophils Relative: 2 %
HCT: 39.8 % — ABNORMAL LOW (ref 40.0–52.0)
Hemoglobin: 13.3 g/dL (ref 13.0–18.0)
Lymphocytes Relative: 13 %
Lymphs Abs: 0.8 10*3/uL — ABNORMAL LOW (ref 1.0–3.6)
MCH: 28.1 pg (ref 26.0–34.0)
MCHC: 33.4 g/dL (ref 32.0–36.0)
MCV: 84.2 fL (ref 80.0–100.0)
MONO ABS: 0.6 10*3/uL (ref 0.2–1.0)
MONOS PCT: 9 %
NEUTROS PCT: 75 %
Neutro Abs: 4.8 10*3/uL (ref 1.4–6.5)
PLATELETS: 173 10*3/uL (ref 150–440)
RBC: 4.73 MIL/uL (ref 4.40–5.90)
RDW: 14.5 % (ref 11.5–14.5)
WBC: 6.4 10*3/uL (ref 3.8–10.6)

## 2015-01-12 DIAGNOSIS — Z510 Encounter for antineoplastic radiation therapy: Secondary | ICD-10-CM | POA: Diagnosis not present

## 2015-01-20 ENCOUNTER — Ambulatory Visit
Admission: RE | Admit: 2015-01-20 | Discharge: 2015-01-20 | Disposition: A | Discharge status: Home / Self Care | Payer: Medicare Other | Source: Ambulatory Visit | Attending: Radiation Oncology | Admitting: Radiation Oncology

## 2015-01-20 ENCOUNTER — Encounter: Payer: Self-pay | Admitting: Radiation Oncology

## 2015-01-20 ENCOUNTER — Other Ambulatory Visit: Payer: Self-pay | Admitting: *Deleted

## 2015-01-20 VITALS — BP 151/85 | HR 78 | Temp 96.5°F | Resp 18 | Wt 230.4 lb

## 2015-01-20 DIAGNOSIS — C61 Malignant neoplasm of prostate: Secondary | ICD-10-CM

## 2015-01-20 DIAGNOSIS — Z510 Encounter for antineoplastic radiation therapy: Secondary | ICD-10-CM | POA: Diagnosis not present

## 2015-01-20 DIAGNOSIS — C7951 Secondary malignant neoplasm of bone: Secondary | ICD-10-CM | POA: Diagnosis not present

## 2015-01-20 NOTE — Progress Notes (Signed)
Radiation Oncology Procedure Note  Name: Derek Parker  MRN: [id number]  Date:   01/20/2015     DOB: 06-13-1941   This 74 y.o. male patient presents to the clinic for thirdXofigo injection.  CHIEF COMPLAINT:  Chief Complaint  Patient presents with  . Follow-up    Pt is here for xofigo #3    DIAGNOSIS: The encounter diagnosis was Malignant neoplasm of prostate metastatic to bone.  HPI: This 74 y.o. male patient presents to the clinic forPatient is a 74 year old male with known stage IV adenocarcinoma the prostate originally presenting with a Gleason 8 (4+4) and a PSA in the 8 range. He completed radiation therapy to his prostate in 2015 although soon presented with widespread bony metastasis. He has been treated with multiple systemic therapies including's Zytiga and prednisone as well as Xtandi,Xgeva. He is seen today for his ThirdXofigo treatment. He is done well since the first treatment. No side effects or complaints.   PHYSICAL EXAM: Well-developed well-nourished patient in NAD. HEENT reveals PERLA, EOMI, discs not visualized.  Oral cavity is clear. No oral mucosal lesions are identified. Neck is clear without evidence of cervical or supraclavicular adenopathy. Lungs are clear to A&P. Cardiac examination is essentially unremarkable with regular rate and rhythm without murmur rub or thrill. Abdomen is benign with no organomegaly or masses noted. Motor sensory and DTR levels are equal and symmetric in the upper and lower extremities. Cranial nerves II through XII are grossly intact. Proprioception is intact. No peripheral adenopathy or edema is identified. No motor or sensory levels are noted. Crude visual fields are within normal range.   BP Readings from Last 1 Encounters:  01/20/15 151/85   Pulse Readings from Last 1 Encounters:  01/20/15 78   Wt Readings from Last 1 Encounters:  01/20/15 230 lb 6.1 oz (104.5 kg)     PROCEDURE: Peripheral line was started on the patient  and 20 cc of normal saline were passed to make sure there was patency of the lines. [name] was administered over a 10 minute infusion push by radiation oncologist. After completion of IV push of Xofigo 30 cc an additional saline were passed through the peripheral line. Oral lines syringes drapes and original container of Xofigo were then taken to nuclear medicine for storage. Patient tolerated the procedure well without side effect or complaint. Patient has anti-emetic medication. Have scheduled the patient for a three-week followup to check on his counts. Patient is to call sooner with any side effects or complaints.   PLAN: as above. Patient is very scheduled for follow-up blood work as well as returning for his fourth injection. He knows to call with any concerns at any time.  [name]., MD

## 2015-02-08 ENCOUNTER — Encounter (INDEPENDENT_AMBULATORY_CARE_PROVIDER_SITE_OTHER): Payer: Self-pay

## 2015-02-08 ENCOUNTER — Inpatient Hospital Stay: Payer: Medicare Other | Attending: Radiation Oncology

## 2015-02-08 DIAGNOSIS — C7951 Secondary malignant neoplasm of bone: Secondary | ICD-10-CM | POA: Diagnosis not present

## 2015-02-08 DIAGNOSIS — C61 Malignant neoplasm of prostate: Secondary | ICD-10-CM | POA: Diagnosis not present

## 2015-02-08 LAB — CBC WITH DIFFERENTIAL/PLATELET
Basophils Absolute: 0 10*3/uL (ref 0–0.1)
Basophils Relative: 1 %
EOS ABS: 0.3 10*3/uL (ref 0–0.7)
Eosinophils Relative: 7 %
HEMATOCRIT: 37.2 % — AB (ref 40.0–52.0)
HEMOGLOBIN: 12.6 g/dL — AB (ref 13.0–18.0)
LYMPHS ABS: 0.7 10*3/uL — AB (ref 1.0–3.6)
LYMPHS PCT: 18 %
MCH: 28.6 pg (ref 26.0–34.0)
MCHC: 33.8 g/dL (ref 32.0–36.0)
MCV: 84.6 fL (ref 80.0–100.0)
Monocytes Absolute: 0.4 10*3/uL (ref 0.2–1.0)
Monocytes Relative: 11 %
NEUTROS PCT: 63 %
Neutro Abs: 2.5 10*3/uL (ref 1.4–6.5)
Platelets: 159 10*3/uL (ref 150–440)
RBC: 4.4 MIL/uL (ref 4.40–5.90)
RDW: 14.7 % — ABNORMAL HIGH (ref 11.5–14.5)
WBC: 3.9 10*3/uL (ref 3.8–10.6)

## 2015-02-09 DIAGNOSIS — Z510 Encounter for antineoplastic radiation therapy: Secondary | ICD-10-CM | POA: Diagnosis not present

## 2015-02-17 ENCOUNTER — Encounter: Payer: Self-pay | Admitting: Radiation Oncology

## 2015-02-17 ENCOUNTER — Ambulatory Visit
Admission: RE | Admit: 2015-02-17 | Discharge: 2015-02-17 | Disposition: A | Discharge status: Home / Self Care | Payer: Medicare Other | Source: Ambulatory Visit | Attending: Radiation Oncology | Admitting: Radiation Oncology

## 2015-02-17 ENCOUNTER — Other Ambulatory Visit: Payer: Self-pay | Admitting: *Deleted

## 2015-02-17 VITALS — BP 153/83 | HR 76 | Temp 98.0°F | Resp 18 | Wt 228.8 lb

## 2015-02-17 DIAGNOSIS — C7951 Secondary malignant neoplasm of bone: Secondary | ICD-10-CM

## 2015-02-17 DIAGNOSIS — C61 Malignant neoplasm of prostate: Secondary | ICD-10-CM

## 2015-02-17 NOTE — Progress Notes (Signed)
Radiation Oncology Procedure Note  Name: Derek Parker  MRN: [id number]  Date:   02/17/2015     DOB: 05-01-1941   This 74 y.o. male patient presents to the clinic for fourth injection of Xofigo for stage IV prostate cancer  CHIEF COMPLAINT:  Chief Complaint  Patient presents with  . Follow-up    #4 xofigo    DIAGNOSIS: The primary encounter diagnosis was Malignant neoplasm of prostate. A diagnosis of Prostate cancer metastatic to bone was also pertinent to this visit.  HPI: This 74 y.o. male patient presents to the clinic forPatient is a 74 year old male with known stage IV adenocarcinoma the prostate originally presenting with a Gleason 8 (4+4) and a PSA in the 8 range. He completed radiation therapy to his prostate in 2015 although soon presented with widespread bony metastasis. He has been treated with multiple systemic therapies including's Zytiga and prednisone as well as Xtandi,Xgeva. He is seen today for his fourth  treatment. He is done well since the first treatment. No side effects or complaints.  PHYSICAL EXAM: Well-developed well-nourished patient in NAD. HEENT reveals PERLA, EOMI, discs not visualized.  Oral cavity is clear. No oral mucosal lesions are identified. Neck is clear without evidence of cervical or supraclavicular adenopathy. Lungs are clear to A&P. Cardiac examination is essentially unremarkable with regular rate and rhythm without murmur rub or thrill. Abdomen is benign with no organomegaly or masses noted. Motor sensory and DTR levels are equal and symmetric in the upper and lower extremities. Cranial nerves II through XII are grossly intact. Proprioception is intact. No peripheral adenopathy or edema is identified. No motor or sensory levels are noted. Crude visual fields are within normal range.   BP Readings from Last 1 Encounters:  02/17/15 153/83   Pulse Readings from Last 1 Encounters:  02/17/15 76   Wt Readings from Last 1 Encounters:  02/17/15 228  lb 13.4 oz (103.8 kg)     PROCEDURE: Peripheral line was started on the patient and 20 cc of normal saline were passed to make sure there was patency of the lines. Derek Parker was administered over a 10 minute infusion push by radiation oncologist. After completion of IV push of Xofigo 30 cc an additional saline were passed through the peripheral line. Oral lines syringes drapes and original container of Xofigo were then taken to nuclear medicine for storage. Patient tolerated the procedure well without side effect or complaint. Patient has anti-emetic medication. Have scheduled the patient for a three-week followup to check on his counts. Patient is to call sooner with any side effects or complaints.   PLAN: Patient continues to do well with no side effects or complaints from his radium treatments. I'm please was overall progress. He scheduled for another month for blood work and his fifthXofigo treatment. Patient is to call with any concerns.  I would like to take this opportunity for allowing me to participate in the care of your patient.Derek Parker., MD

## 2015-02-18 DIAGNOSIS — Z510 Encounter for antineoplastic radiation therapy: Secondary | ICD-10-CM | POA: Diagnosis not present

## 2015-03-08 ENCOUNTER — Inpatient Hospital Stay: Payer: Medicare Other | Attending: Radiation Oncology

## 2015-03-08 ENCOUNTER — Encounter: Payer: Self-pay | Admitting: *Deleted

## 2015-03-08 DIAGNOSIS — C61 Malignant neoplasm of prostate: Secondary | ICD-10-CM

## 2015-03-08 DIAGNOSIS — C7951 Secondary malignant neoplasm of bone: Secondary | ICD-10-CM | POA: Insufficient documentation

## 2015-03-08 DIAGNOSIS — Z510 Encounter for antineoplastic radiation therapy: Secondary | ICD-10-CM | POA: Diagnosis not present

## 2015-03-08 LAB — CBC WITH DIFFERENTIAL/PLATELET
BASOS PCT: 1 %
Basophils Absolute: 0 10*3/uL (ref 0–0.1)
EOS ABS: 0.2 10*3/uL (ref 0–0.7)
Eosinophils Relative: 5 %
HEMATOCRIT: 37.4 % — AB (ref 40.0–52.0)
Hemoglobin: 12.7 g/dL — ABNORMAL LOW (ref 13.0–18.0)
Lymphocytes Relative: 14 %
Lymphs Abs: 0.7 10*3/uL — ABNORMAL LOW (ref 1.0–3.6)
MCH: 28.5 pg (ref 26.0–34.0)
MCHC: 33.8 g/dL (ref 32.0–36.0)
MCV: 84.4 fL (ref 80.0–100.0)
MONOS PCT: 10 %
Monocytes Absolute: 0.5 10*3/uL (ref 0.2–1.0)
NEUTROS ABS: 3.4 10*3/uL (ref 1.4–6.5)
Neutrophils Relative %: 70 %
Platelets: 182 10*3/uL (ref 150–440)
RBC: 4.44 MIL/uL (ref 4.40–5.90)
RDW: 14.4 % (ref 11.5–14.5)
WBC: 4.8 10*3/uL (ref 3.8–10.6)

## 2015-03-17 ENCOUNTER — Encounter: Payer: Self-pay | Admitting: Radiation Oncology

## 2015-03-17 ENCOUNTER — Other Ambulatory Visit: Payer: Self-pay | Admitting: *Deleted

## 2015-03-17 ENCOUNTER — Ambulatory Visit
Admission: RE | Admit: 2015-03-17 | Discharge: 2015-03-17 | Disposition: A | Discharge status: Home / Self Care | Payer: Medicare Other | Source: Ambulatory Visit | Attending: Radiation Oncology | Admitting: Radiation Oncology

## 2015-03-17 ENCOUNTER — Ambulatory Visit: Payer: Medicare Other | Admitting: Radiation Oncology

## 2015-03-17 VITALS — BP 175/86 | HR 68 | Resp 18 | Wt 237.0 lb

## 2015-03-17 DIAGNOSIS — C61 Malignant neoplasm of prostate: Secondary | ICD-10-CM

## 2015-03-17 DIAGNOSIS — Z510 Encounter for antineoplastic radiation therapy: Secondary | ICD-10-CM | POA: Diagnosis not present

## 2015-03-17 DIAGNOSIS — C7951 Secondary malignant neoplasm of bone: Principal | ICD-10-CM

## 2015-03-17 NOTE — Progress Notes (Signed)
Radiation Oncology Follow up Note  Name: Derek Parker   Date:   03/17/2015 MRN:  [id number] DOB: 02/15/1941    This 74 y.o. male presents to the clinic today for fifth Xofigo injection.  REFERRING PROVIDER: Juluis Pitch, MD  HPI: Patient is a 74 year old male with known stage IV adenocarcinoma the prostate originally Gleason 8 (4+4) presenting the PSA in the 8 range. He is now seen for his fifthXofigo injection. He is doing well bone pain is under good control he is scheduled for left knee surgery in the near future. That is the only site of pain he is having. His PSAs continue to be in around the 180 range..  COMPLICATIONS OF TREATMENT: none  FOLLOW UP COMPLIANCE: keeps appointments   PHYSICAL EXAM:  BP 175/86 mmHg  Pulse 68  Resp 18  Wt 236 lb 15.9 oz (107.5 kg) Well-developed well-nourished patient in NAD. HEENT reveals PERLA, EOMI, discs not visualized.  Oral cavity is clear. No oral mucosal lesions are identified. Neck is clear without evidence of cervical or supraclavicular adenopathy. Lungs are clear to A&P. Cardiac examination is essentially unremarkable with regular rate and rhythm without murmur rub or thrill. Abdomen is benign with no organomegaly or masses noted. Motor sensory and DTR levels are equal and symmetric in the upper and lower extremities. Cranial nerves II through XII are grossly intact. Proprioception is intact. No peripheral adenopathy or edema is identified. No motor or sensory levels are noted. Crude visual fields are within normal range.   RADIOLOGY RESULTS: No current films for review  Procedure: Peripheral line was started on the patient and 20 cc of normal saline were passed to make sure there was patency of the lines. [name] was administered over a 10 minute infusion push by radiation oncologist. After completion of IV push of Xofigo 30 cc an additional saline were passed through the peripheral line. Oral lines syringes drapes and original  container of Xofigo were then taken to nuclear medicine for storage. Patient tolerated the procedure well without side effect or complaint. Patient has anti-emetic medication. Have scheduled the patient for a three-week followup to check on his counts. Patient is to call sooner with any side effects or complaints.  As above patient is a rescheduled for his follow-up for blood work and his sixth and last injection. He is tolerated strips extremely well. Patient knows to call with any concerns.  I would like to take this opportunity for allowing me to participate in the care of your patient.[name]., MD

## 2015-04-05 ENCOUNTER — Inpatient Hospital Stay: Payer: Medicare Other | Attending: Radiation Oncology

## 2015-04-05 DIAGNOSIS — C61 Malignant neoplasm of prostate: Secondary | ICD-10-CM | POA: Diagnosis not present

## 2015-04-05 DIAGNOSIS — C7951 Secondary malignant neoplasm of bone: Secondary | ICD-10-CM | POA: Diagnosis not present

## 2015-04-05 DIAGNOSIS — Z510 Encounter for antineoplastic radiation therapy: Secondary | ICD-10-CM | POA: Diagnosis not present

## 2015-04-05 LAB — CBC WITH DIFFERENTIAL/PLATELET
BASOS ABS: 0 10*3/uL (ref 0–0.1)
Basophils Relative: 1 %
EOS ABS: 0.2 10*3/uL (ref 0–0.7)
Eosinophils Relative: 4 %
HCT: 34.8 % — ABNORMAL LOW (ref 40.0–52.0)
Hemoglobin: 11.9 g/dL — ABNORMAL LOW (ref 13.0–18.0)
Lymphocytes Relative: 13 %
Lymphs Abs: 0.6 10*3/uL — ABNORMAL LOW (ref 1.0–3.6)
MCH: 28.6 pg (ref 26.0–34.0)
MCHC: 34 g/dL (ref 32.0–36.0)
MCV: 84.1 fL (ref 80.0–100.0)
Monocytes Absolute: 0.5 10*3/uL (ref 0.2–1.0)
Monocytes Relative: 12 %
Neutro Abs: 3 10*3/uL (ref 1.4–6.5)
Neutrophils Relative %: 70 %
PLATELETS: 149 10*3/uL — AB (ref 150–440)
RBC: 4.14 MIL/uL — ABNORMAL LOW (ref 4.40–5.90)
RDW: 14.5 % (ref 11.5–14.5)
WBC: 4.3 10*3/uL (ref 3.8–10.6)

## 2015-04-06 DIAGNOSIS — Z510 Encounter for antineoplastic radiation therapy: Secondary | ICD-10-CM | POA: Diagnosis not present

## 2015-04-14 ENCOUNTER — Encounter: Payer: Self-pay | Admitting: Radiation Oncology

## 2015-04-14 ENCOUNTER — Ambulatory Visit
Admission: RE | Admit: 2015-04-14 | Discharge: 2015-04-14 | Disposition: A | Discharge status: Home / Self Care | Payer: Medicare Other | Source: Ambulatory Visit | Attending: Radiation Oncology | Admitting: Radiation Oncology

## 2015-04-14 VITALS — BP 133/71 | HR 68 | Temp 95.8°F | Resp 18 | Wt 231.5 lb

## 2015-04-14 DIAGNOSIS — C7951 Secondary malignant neoplasm of bone: Principal | ICD-10-CM

## 2015-04-14 DIAGNOSIS — Z510 Encounter for antineoplastic radiation therapy: Secondary | ICD-10-CM | POA: Diagnosis not present

## 2015-04-14 DIAGNOSIS — C61 Malignant neoplasm of prostate: Secondary | ICD-10-CM

## 2015-04-14 NOTE — Progress Notes (Signed)
Radiation Oncology Follow up Note  Name: Derek Parker   Date:   04/14/2015 MRN:  [id number] DOB: December 09, 1940    This 74 y.o. male presents to the clinic today for sixth and last Xofigo injection  REFERRING PROVIDER: Juluis Pitch, MD  HPI: Patient is a 74 year old male seen today for his last 68 out of 6 Xofigo injection. He is doing well. 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 He is doing well pain is under good control. Blood parameters are within normal limits..  COMPLICATIONS OF TREATMENT: none  FOLLOW UP COMPLIANCE: keeps appointments   PHYSICAL EXAM:  BP 133/71 mmHg  Pulse 68  Temp(Src) 95.8 F (35.4 C)  Resp 18  Wt 231 lb 7.7 oz (105 kg) Well-developed well-nourished patient in NAD. HEENT reveals PERLA, EOMI, discs not visualized.  Oral cavity is clear. No oral mucosal lesions are identified. Neck is clear without evidence of cervical or supraclavicular adenopathy. Lungs are clear to A&P. Cardiac examination is essentially unremarkable with regular rate and rhythm without murmur rub or thrill. Abdomen is benign with no organomegaly or masses noted. Motor sensory and DTR levels are equal and symmetric in the upper and lower extremities. Cranial nerves II through XII are grossly intact. Proprioception is intact. No peripheral adenopathy or edema is identified. No motor or sensory levels are noted. Crude visual fields are within normal range.  RADIOLOGY RESULTS: No current films for review  PLAN: Peripheral line was started on the patient and 20 cc of normal saline were passed to make sure there was patency of the lines. [name] was administered over a 10 minute infusion push by radiation oncologist. After completion of IV push of Xofigo 30 cc an additional saline were passed through the peripheral line. Oral lines syringes drapes and original container of Xofigo were then taken to nuclear medicine for storage.  Patient tolerated the procedure well without side effect or complaint. Patient has anti-emetic medication. Patient received his lastXofigo injection today. Tolerated extremely well as described above. We'll see him back in 1 month for follow-up. Patient knows to call sooner with any concerns.  I would like to take this opportunity for allowing me to participate in the care of your patient.[name]., MD

## 2015-05-12 ENCOUNTER — Encounter
Admission: RE | Admit: 2015-05-12 | Discharge: 2015-05-12 | Disposition: A | Discharge status: Home / Self Care | Payer: Medicare Other | Source: Ambulatory Visit | Attending: Orthopedic Surgery | Admitting: Orthopedic Surgery

## 2015-05-12 DIAGNOSIS — Z01812 Encounter for preprocedural laboratory examination: Secondary | ICD-10-CM | POA: Insufficient documentation

## 2015-05-12 DIAGNOSIS — I10 Essential (primary) hypertension: Secondary | ICD-10-CM | POA: Diagnosis not present

## 2015-05-12 DIAGNOSIS — E119 Type 2 diabetes mellitus without complications: Secondary | ICD-10-CM | POA: Diagnosis not present

## 2015-05-12 DIAGNOSIS — I2510 Atherosclerotic heart disease of native coronary artery without angina pectoris: Secondary | ICD-10-CM | POA: Diagnosis not present

## 2015-05-12 DIAGNOSIS — C61 Malignant neoplasm of prostate: Secondary | ICD-10-CM | POA: Insufficient documentation

## 2015-05-12 HISTORY — DX: Gastro-esophageal reflux disease without esophagitis: K21.9

## 2015-05-12 HISTORY — DX: Acute myocardial infarction, unspecified: I21.9

## 2015-05-12 HISTORY — DX: Polyneuropathy, unspecified: G62.9

## 2015-05-12 LAB — CBC
HCT: 33.3 % — ABNORMAL LOW (ref 40.0–52.0)
Hemoglobin: 11.2 g/dL — ABNORMAL LOW (ref 13.0–18.0)
MCH: 28.4 pg (ref 26.0–34.0)
MCHC: 33.6 g/dL (ref 32.0–36.0)
MCV: 84.4 fL (ref 80.0–100.0)
PLATELETS: 179 10*3/uL (ref 150–440)
RBC: 3.95 MIL/uL — ABNORMAL LOW (ref 4.40–5.90)
RDW: 14.6 % — AB (ref 11.5–14.5)
WBC: 5 10*3/uL (ref 3.8–10.6)

## 2015-05-12 LAB — BASIC METABOLIC PANEL
Anion gap: 9 (ref 5–15)
BUN: 27 mg/dL — ABNORMAL HIGH (ref 6–20)
CALCIUM: 9.2 mg/dL (ref 8.9–10.3)
CO2: 24 mmol/L (ref 22–32)
CREATININE: 1.14 mg/dL (ref 0.61–1.24)
Chloride: 106 mmol/L (ref 101–111)
Glucose, Bld: 132 mg/dL — ABNORMAL HIGH (ref 65–99)
Potassium: 4 mmol/L (ref 3.5–5.1)
Sodium: 139 mmol/L (ref 135–145)

## 2015-05-12 LAB — URINALYSIS COMPLETE WITH MICROSCOPIC (ARMC ONLY)
BACTERIA UA: NONE SEEN
Bilirubin Urine: NEGATIVE
Glucose, UA: NEGATIVE mg/dL
Hgb urine dipstick: NEGATIVE
KETONES UR: NEGATIVE mg/dL
LEUKOCYTES UA: NEGATIVE
Nitrite: NEGATIVE
PH: 5 (ref 5.0–8.0)
PROTEIN: NEGATIVE mg/dL
SPECIFIC GRAVITY, URINE: 1.021 (ref 1.005–1.030)

## 2015-05-12 LAB — TYPE AND SCREEN
ABO/RH(D): O NEG
Antibody Screen: NEGATIVE

## 2015-05-12 LAB — APTT: aPTT: 33 seconds (ref 24–36)

## 2015-05-12 LAB — SEDIMENTATION RATE: SED RATE: 52 mm/h — AB (ref 0–20)

## 2015-05-12 LAB — HEMOGLOBIN A1C: HEMOGLOBIN A1C: 5.8 % (ref 4.0–6.0)

## 2015-05-12 LAB — SURGICAL PCR SCREEN
MRSA, PCR: NEGATIVE
Staphylococcus aureus: NEGATIVE

## 2015-05-12 LAB — PROTIME-INR
INR: 1.1
Prothrombin Time: 14.4 seconds (ref 11.4–15.0)

## 2015-05-12 LAB — ABO/RH: ABO/RH(D): O NEG

## 2015-05-12 NOTE — Patient Instructions (Signed)
  Your procedure is scheduled on: Monday 05/31/2015 Report to Day Surgery. 2ND FLOOR MEDICAL MALL ENTRANCE To find out your arrival time please call [phone] between 1PM - 3PM on Friday 05/28/2015.  Remember: Instructions that are not followed completely may result in serious medical risk, up to and including death, or upon the discretion of your surgeon and anesthesiologist your surgery may need to be rescheduled.    __X__ 1. Do not eat food or drink liquids after midnight. No gum chewing or hard candies.     __X__ 2. No Alcohol for 24 hours before or after surgery.   ____ 3. Bring all medications with you on the day of surgery if instructed.    __X__ 4. Notify your doctor if there is any change in your medical condition     (cold, fever, infections).     Do not wear jewelry, make-up, hairpins, clips or nail polish.  Do not wear lotions, powders, or perfumes.   Do not shave 48 hours prior to surgery. Men may shave face and neck.  Do not bring valuables to the hospital.    [facility] is not responsible for any belongings or valuables.               Contacts, dentures or bridgework may not be worn into surgery.  Leave your suitcase in the car. After surgery it may be brought to your room.  For patients admitted to the hospital, discharge time is determined by your                treatment team.   Patients discharged the day of surgery will not be allowed to drive home.   Please read over the following fact sheets that you were given:   MRSA Information and Surgical Site Infection Prevention   __X__ Take these medicines the morning of surgery with A SIP OF WATER:    1. DEXILANT  2. METOPROLOL  3. VENLAFAXINE  4.  5.  6.  ____ Fleet Enema (as directed)   __X__ Use CHG Soap as directed  ____ Use inhalers on the day of surgery  ____ Stop metformin 2 days prior to surgery    ____ Take 1/2 of usual insulin dose the night before surgery and none on the morning of surgery.    __X__ Stop Coumadin/Plavix/aspirin on AS INSTRUCTED BY YOUR PHYSICIAN 7 DAYS BEFORE SURGERY  __X__ Stop Anti-inflammatories on STOP ALEVE 7 DAYS BEFORE SURGERY   __X__ Stop supplements until after surgery.   STOP MELATONIN AND COQ10 7 DAYS BEFORE SURGERY  __X__ Bring C-Pap to the hospital.

## 2015-05-13 LAB — URINE CULTURE

## 2015-05-19 ENCOUNTER — Ambulatory Visit
Admission: RE | Admit: 2015-05-19 | Discharge: 2015-05-19 | Disposition: A | Discharge status: Home / Self Care | Payer: Medicare Other | Source: Ambulatory Visit | Attending: Radiation Oncology | Admitting: Radiation Oncology

## 2015-05-19 ENCOUNTER — Encounter: Payer: Self-pay | Admitting: Radiation Oncology

## 2015-05-19 VITALS — BP 120/72 | HR 80 | Temp 98.0°F | Resp 20 | Wt 223.5 lb

## 2015-05-19 DIAGNOSIS — C61 Malignant neoplasm of prostate: Secondary | ICD-10-CM

## 2015-05-19 DIAGNOSIS — C7951 Secondary malignant neoplasm of bone: Principal | ICD-10-CM

## 2015-05-19 NOTE — Progress Notes (Signed)
Radiation Oncology Follow up Note  Name: [facility]   Date:   05/19/2015 MRN:  [id number] DOB: Nov 10, 1940    This 74 y.o. 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 He is seen today in routine follow-up and is doing well. He is currently on Lupron therapy tongue that well without side effect. He states his pain is under excellent control. He is scheduled for left knee replacement next month. He is taking no pain medication at this time..  COMPLICATIONS OF TREATMENT: none  FOLLOW UP COMPLIANCE: keeps appointments   PHYSICAL EXAM:  BP 120/72 mmHg  Pulse 80  Temp(Src) 98 F (36.7 C)  Resp 20  Wt 223 lb 8.7 oz (101.4 kg) Well-developed well-nourished patient in NAD. HEENT reveals PERLA, EOMI, discs not visualized.  Oral cavity is clear. No oral mucosal lesions are identified. Neck is clear without evidence of cervical or supraclavicular adenopathy. Lungs are clear to A&P. Cardiac examination is essentially unremarkable with regular rate and rhythm without murmur rub or thrill. Abdomen is benign with no organomegaly or masses noted. Motor sensory and DTR levels are equal and symmetric in the upper and lower extremities. Cranial nerves II through XII are grossly intact. Proprioception is intact. No peripheral adenopathy or edema is identified. No motor or sensory levels are noted. Crude visual fields are within normal range.  RADIOLOGY RESULTS: No current films for review  PLAN: At the present time he is doing well he is under excellent palliative control of his stage IV prostate cancer. I am please was overall progress. I've asked to see him back in 4-5 months for follow-up. He has a knee replacement next month. Patient knows to call sooner with any concerns.  I would like to  take this opportunity for allowing me to participate in the care of your patient.[name]., MD

## 2015-05-31 ENCOUNTER — Inpatient Hospital Stay: Payer: Medicare Other

## 2015-05-31 ENCOUNTER — Inpatient Hospital Stay: Payer: Medicare Other | Admitting: Certified Registered Nurse Anesthetist

## 2015-05-31 ENCOUNTER — Inpatient Hospital Stay
Admission: RE | Admit: 2015-05-31 | Discharge: 2015-06-02 | DRG: 470 | Disposition: A | Discharge status: Home / Self Care | Payer: Medicare Other | Source: Ambulatory Visit | Attending: Orthopedic Surgery | Admitting: Orthopedic Surgery

## 2015-05-31 ENCOUNTER — Encounter: Payer: Self-pay | Admitting: Orthopedic Surgery

## 2015-05-31 ENCOUNTER — Encounter
Admission: RE | Disposition: A | Discharge status: Home / Self Care | Payer: Self-pay | Source: Ambulatory Visit | Attending: Orthopedic Surgery

## 2015-05-31 DIAGNOSIS — M1712 Unilateral primary osteoarthritis, left knee: Principal | ICD-10-CM | POA: Diagnosis present

## 2015-05-31 DIAGNOSIS — Z8546 Personal history of malignant neoplasm of prostate: Secondary | ICD-10-CM

## 2015-05-31 DIAGNOSIS — I10 Essential (primary) hypertension: Secondary | ICD-10-CM | POA: Diagnosis present

## 2015-05-31 DIAGNOSIS — C7951 Secondary malignant neoplasm of bone: Secondary | ICD-10-CM | POA: Diagnosis present

## 2015-05-31 DIAGNOSIS — I2510 Atherosclerotic heart disease of native coronary artery without angina pectoris: Secondary | ICD-10-CM | POA: Diagnosis present

## 2015-05-31 DIAGNOSIS — E1140 Type 2 diabetes mellitus with diabetic neuropathy, unspecified: Secondary | ICD-10-CM | POA: Diagnosis present

## 2015-05-31 DIAGNOSIS — E7800 Pure hypercholesterolemia, unspecified: Secondary | ICD-10-CM | POA: Diagnosis present

## 2015-05-31 DIAGNOSIS — I252 Old myocardial infarction: Secondary | ICD-10-CM

## 2015-05-31 DIAGNOSIS — K219 Gastro-esophageal reflux disease without esophagitis: Secondary | ICD-10-CM | POA: Diagnosis present

## 2015-05-31 DIAGNOSIS — Z96659 Presence of unspecified artificial knee joint: Secondary | ICD-10-CM

## 2015-05-31 HISTORY — PX: KNEE ARTHROPLASTY: SHX992

## 2015-05-31 LAB — TYPE AND SCREEN
ABO/RH(D): O NEG
Antibody Screen: NEGATIVE

## 2015-05-31 LAB — GLUCOSE, CAPILLARY
Glucose-Capillary: 130 mg/dL — ABNORMAL HIGH (ref 65–99)
Glucose-Capillary: 132 mg/dL — ABNORMAL HIGH (ref 65–99)
Glucose-Capillary: 140 mg/dL — ABNORMAL HIGH (ref 65–99)

## 2015-05-31 SURGERY — ARTHROPLASTY, KNEE, TOTAL, USING IMAGELESS COMPUTER-ASSISTED NAVIGATION
Anesthesia: General | Laterality: Left

## 2015-05-31 MED ORDER — MENTHOL 3 MG MT LOZG: 1.0000 | LOZENGE | OROMUCOSAL | Status: DC | PRN

## 2015-05-31 MED ORDER — SODIUM CHLORIDE 0.9 % IV SOLN
INTRAVENOUS | Status: DC
  Administered 2015-05-31 – 2015-06-01 (×3): via INTRAVENOUS

## 2015-05-31 MED ORDER — VITAMIN D 1000 UNITS PO TABS
5000.0000 [IU] | ORAL_TABLET | Freq: Every day | ORAL | Status: DC
  Administered 2015-05-31 – 2015-06-02 (×3): 5000 [IU] via ORAL
  Filled 2015-05-31 (×5): qty 5

## 2015-05-31 MED ORDER — MAGNESIUM HYDROXIDE 400 MG/5ML PO SUSP
30.0000 mL | Freq: Every day | ORAL | Status: DC | PRN
  Administered 2015-06-01: 30 mL via ORAL
  Filled 2015-05-31: qty 30

## 2015-05-31 MED ORDER — SODIUM CHLORIDE 0.9 % IV SOLN
Freq: Once | INTRAVENOUS | Status: AC
  Administered 2015-05-31: 16:00:00 via INTRAVENOUS

## 2015-05-31 MED ORDER — ROSUVASTATIN CALCIUM 20 MG PO TABS
20.0000 mg | ORAL_TABLET | Freq: Every day | ORAL | Status: DC
  Administered 2015-05-31 – 2015-06-02 (×3): 20 mg via ORAL
  Filled 2015-05-31 (×3): qty 1

## 2015-05-31 MED ORDER — ONDANSETRON HCL 4 MG/2ML IJ SOLN: 4.0000 mg | Freq: Once | INTRAMUSCULAR | Status: DC | PRN

## 2015-05-31 MED ORDER — ACETAMINOPHEN 650 MG RE SUPP: 650.0000 mg | Freq: Four times a day (QID) | RECTAL | Status: DC | PRN

## 2015-05-31 MED ORDER — SODIUM CHLORIDE 0.9 % IV SOLN
INTRAVENOUS | Status: DC
  Administered 2015-05-31: 07:00:00 via INTRAVENOUS

## 2015-05-31 MED ORDER — CEFAZOLIN SODIUM-DEXTROSE 2-3 GM-% IV SOLR
INTRAVENOUS | Status: AC
  Filled 2015-05-31: qty 50

## 2015-05-31 MED ORDER — CEFAZOLIN SODIUM-DEXTROSE 2-3 GM-% IV SOLR
2.0000 g | Freq: Once | INTRAVENOUS | Status: AC
  Administered 2015-05-31: 2 g via INTRAVENOUS

## 2015-05-31 MED ORDER — ACETAMINOPHEN 10 MG/ML IV SOLN
INTRAVENOUS | Status: DC | PRN
  Administered 2015-05-31: 1000 mg via INTRAVENOUS

## 2015-05-31 MED ORDER — CALCIUM CARBONATE-VITAMIN D 500-200 MG-UNIT PO TABS
1.0000 | ORAL_TABLET | Freq: Two times a day (BID) | ORAL | Status: DC
  Administered 2015-05-31 – 2015-06-02 (×4): 1 via ORAL
  Filled 2015-05-31 (×4): qty 1

## 2015-05-31 MED ORDER — PROPOFOL 500 MG/50ML IV EMUL
INTRAVENOUS | Status: DC | PRN
  Administered 2015-05-31: 65 ug/kg/min via INTRAVENOUS
  Administered 2015-05-31: 09:00:00 via INTRAVENOUS
  Administered 2015-05-31: 55 ug/kg/min via INTRAVENOUS

## 2015-05-31 MED ORDER — FLEET ENEMA 7-19 GM/118ML RE ENEM: 1.0000 | ENEMA | Freq: Once | RECTAL | Status: DC | PRN

## 2015-05-31 MED ORDER — TRAMADOL HCL 50 MG PO TABS
50.0000 mg | ORAL_TABLET | ORAL | Status: DC | PRN
  Administered 2015-06-01: 50 mg via ORAL
  Administered 2015-06-01: 100 mg via ORAL
  Administered 2015-06-01: 50 mg via ORAL
  Filled 2015-05-31: qty 1
  Filled 2015-05-31: qty 2
  Filled 2015-05-31: qty 1

## 2015-05-31 MED ORDER — NIACIN ER 500 MG PO CPCR
500.0000 mg | ORAL_CAPSULE | Freq: Every morning | ORAL | Status: DC
  Administered 2015-06-01 – 2015-06-02 (×2): 500 mg via ORAL
  Filled 2015-05-31 (×3): qty 1

## 2015-05-31 MED ORDER — BUPIVACAINE HCL (PF) 0.5 % IJ SOLN
INTRAMUSCULAR | Status: DC | PRN
  Administered 2015-05-31: 3 mL

## 2015-05-31 MED ORDER — ALUM & MAG HYDROXIDE-SIMETH 200-200-20 MG/5ML PO SUSP: 30.0000 mL | ORAL | Status: DC | PRN

## 2015-05-31 MED ORDER — DIPHENHYDRAMINE HCL 12.5 MG/5ML PO ELIX: 12.5000 mg | ORAL_SOLUTION | ORAL | Status: DC | PRN

## 2015-05-31 MED ORDER — ALFUZOSIN HCL ER 10 MG PO TB24
10.0000 mg | ORAL_TABLET | Freq: Every day | ORAL | Status: DC
  Administered 2015-05-31 – 2015-06-02 (×3): 10 mg via ORAL
  Filled 2015-05-31 (×3): qty 1

## 2015-05-31 MED ORDER — FENTANYL CITRATE (PF) 100 MCG/2ML IJ SOLN
INTRAMUSCULAR | Status: DC | PRN
  Administered 2015-05-31 (×2): 25 ug via INTRAVENOUS
  Administered 2015-05-31: 50 ug via INTRAVENOUS

## 2015-05-31 MED ORDER — OXYCODONE HCL 5 MG PO TABS
5.0000 mg | ORAL_TABLET | ORAL | Status: DC | PRN
Start: 2015-05-31 — End: 2015-06-02
  Administered 2015-05-31 (×2): 5 mg via ORAL
  Administered 2015-06-01 (×2): 10 mg via ORAL
  Administered 2015-06-02: 5 mg via ORAL
  Administered 2015-06-02: 10 mg via ORAL
  Filled 2015-05-31 (×2): qty 2
  Filled 2015-05-31 (×2): qty 1
  Filled 2015-05-31 (×2): qty 2

## 2015-05-31 MED ORDER — VENLAFAXINE HCL ER 75 MG PO CP24
225.0000 mg | ORAL_CAPSULE | Freq: Every day | ORAL | Status: DC
  Administered 2015-06-01 – 2015-06-02 (×2): 225 mg via ORAL
  Filled 2015-05-31 (×2): qty 3

## 2015-05-31 MED ORDER — ADULT MULTIVITAMIN W/MINERALS CH
ORAL_TABLET | Freq: Every day | ORAL | Status: DC
  Administered 2015-06-01 – 2015-06-02 (×2): 1 via ORAL
  Filled 2015-05-31 (×2): qty 1

## 2015-05-31 MED ORDER — ACETAMINOPHEN 325 MG PO TABS: 650.0000 mg | ORAL_TABLET | Freq: Four times a day (QID) | ORAL | Status: DC | PRN

## 2015-05-31 MED ORDER — NEOMYCIN-POLYMYXIN B GU 40-200000 IR SOLN
Status: AC
  Filled 2015-05-31: qty 20

## 2015-05-31 MED ORDER — METOCLOPRAMIDE HCL 10 MG PO TABS
10.0000 mg | ORAL_TABLET | Freq: Three times a day (TID) | ORAL | Status: AC
  Administered 2015-05-31 – 2015-06-02 (×8): 10 mg via ORAL
  Filled 2015-05-31 (×8): qty 1

## 2015-05-31 MED ORDER — LACTATED RINGERS IV SOLN
INTRAVENOUS | Status: DC | PRN
  Administered 2015-05-31: 09:00:00 via INTRAVENOUS

## 2015-05-31 MED ORDER — FENTANYL CITRATE (PF) 100 MCG/2ML IJ SOLN
INTRAMUSCULAR | Status: AC
  Administered 2015-05-31: 25 ug via INTRAVENOUS
  Filled 2015-05-31: qty 2

## 2015-05-31 MED ORDER — ACETAMINOPHEN 10 MG/ML IV SOLN
INTRAVENOUS | Status: AC
Start: 2015-05-31 — End: 2015-05-31
  Filled 2015-05-31: qty 100

## 2015-05-31 MED ORDER — KETAMINE HCL 10 MG/ML IJ SOLN
INTRAMUSCULAR | Status: DC | PRN
  Administered 2015-05-31 (×2): 20 mg via INTRAVENOUS

## 2015-05-31 MED ORDER — INSULIN ASPART 100 UNIT/ML ~~LOC~~ SOLN
0.0000 [IU] | Freq: Three times a day (TID) | SUBCUTANEOUS | Status: DC
  Administered 2015-05-31: 2 [IU] via SUBCUTANEOUS
  Administered 2015-06-01: 3 [IU] via SUBCUTANEOUS
  Administered 2015-06-01: 2 [IU] via SUBCUTANEOUS
  Filled 2015-05-31 (×2): qty 2
  Filled 2015-05-31: qty 3

## 2015-05-31 MED ORDER — MORPHINE SULFATE (PF) 2 MG/ML IV SOLN
2.0000 mg | INTRAVENOUS | Status: DC | PRN
  Administered 2015-05-31 (×2): 2 mg via INTRAVENOUS
  Filled 2015-05-31: qty 1

## 2015-05-31 MED ORDER — ONDANSETRON HCL 4 MG/2ML IJ SOLN
INTRAMUSCULAR | Status: DC | PRN
  Administered 2015-05-31: 4 mg via INTRAVENOUS

## 2015-05-31 MED ORDER — FERROUS SULFATE 325 (65 FE) MG PO TABS
325.0000 mg | ORAL_TABLET | Freq: Two times a day (BID) | ORAL | Status: DC
  Administered 2015-05-31 – 2015-06-02 (×4): 325 mg via ORAL
  Filled 2015-05-31 (×4): qty 1

## 2015-05-31 MED ORDER — ONDANSETRON HCL 4 MG PO TABS: 4.0000 mg | ORAL_TABLET | Freq: Four times a day (QID) | ORAL | Status: DC | PRN

## 2015-05-31 MED ORDER — BUPIVACAINE-EPINEPHRINE (PF) 0.25% -1:200000 IJ SOLN
INTRAMUSCULAR | Status: AC
  Filled 2015-05-31: qty 30

## 2015-05-31 MED ORDER — PHENOL 1.4 % MT LIQD: 1.0000 | OROMUCOSAL | Status: DC | PRN

## 2015-05-31 MED ORDER — DARIFENACIN HYDROBROMIDE ER 7.5 MG PO TB24
7.5000 mg | ORAL_TABLET | Freq: Every day | ORAL | Status: DC
  Administered 2015-05-31 – 2015-06-02 (×3): 7.5 mg via ORAL
  Filled 2015-05-31 (×3): qty 1

## 2015-05-31 MED ORDER — PANTOPRAZOLE SODIUM 40 MG PO TBEC
40.0000 mg | DELAYED_RELEASE_TABLET | Freq: Two times a day (BID) | ORAL | Status: DC
  Administered 2015-05-31 – 2015-06-02 (×5): 40 mg via ORAL
  Filled 2015-05-31 (×5): qty 1

## 2015-05-31 MED ORDER — FENTANYL CITRATE (PF) 100 MCG/2ML IJ SOLN
25.0000 ug | INTRAMUSCULAR | Status: DC | PRN
  Administered 2015-05-31 (×4): 25 ug via INTRAVENOUS

## 2015-05-31 MED ORDER — MORPHINE SULFATE (PF) 2 MG/ML IV SOLN
INTRAVENOUS | Status: AC
  Administered 2015-05-31: 2 mg via INTRAVENOUS
  Filled 2015-05-31: qty 1

## 2015-05-31 MED ORDER — SENNOSIDES-DOCUSATE SODIUM 8.6-50 MG PO TABS
1.0000 | ORAL_TABLET | Freq: Two times a day (BID) | ORAL | Status: DC
  Administered 2015-05-31 – 2015-06-02 (×5): 1 via ORAL
  Filled 2015-05-31 (×6): qty 1

## 2015-05-31 MED ORDER — GLYCOPYRROLATE 0.2 MG/ML IJ SOLN
INTRAMUSCULAR | Status: DC | PRN
  Administered 2015-05-31: 0.2 mg via INTRAVENOUS

## 2015-05-31 MED ORDER — BISACODYL 10 MG RE SUPP
10.0000 mg | Freq: Every day | RECTAL | Status: DC | PRN
  Administered 2015-06-02: 10 mg via RECTAL
  Filled 2015-05-31: qty 1

## 2015-05-31 MED ORDER — ACETAMINOPHEN 10 MG/ML IV SOLN
1000.0000 mg | Freq: Four times a day (QID) | INTRAVENOUS | Status: AC
  Administered 2015-05-31 – 2015-06-01 (×4): 1000 mg via INTRAVENOUS
  Filled 2015-05-31 (×4): qty 100

## 2015-05-31 MED ORDER — ONDANSETRON HCL 4 MG/2ML IJ SOLN
4.0000 mg | Freq: Four times a day (QID) | INTRAMUSCULAR | Status: DC | PRN
  Administered 2015-05-31: 4 mg via INTRAVENOUS
  Filled 2015-05-31: qty 2

## 2015-05-31 MED ORDER — CEFAZOLIN SODIUM-DEXTROSE 2-3 GM-% IV SOLR
2.0000 g | Freq: Four times a day (QID) | INTRAVENOUS | Status: AC
Start: 2015-05-31 — End: 2015-06-01
  Administered 2015-05-31 – 2015-06-01 (×4): 2 g via INTRAVENOUS
  Filled 2015-05-31 (×4): qty 50

## 2015-05-31 MED ORDER — BUPIVACAINE LIPOSOME 1.3 % IJ SUSP
INTRAMUSCULAR | Status: AC
  Filled 2015-05-31: qty 20

## 2015-05-31 MED ORDER — METOPROLOL TARTRATE 50 MG PO TABS
75.0000 mg | ORAL_TABLET | Freq: Two times a day (BID) | ORAL | Status: DC
  Administered 2015-05-31 – 2015-06-02 (×3): 75 mg via ORAL
  Filled 2015-05-31 (×4): qty 1

## 2015-05-31 MED ORDER — SODIUM CHLORIDE 0.9 % IJ SOLN
INTRAMUSCULAR | Status: AC
  Filled 2015-05-31: qty 50

## 2015-05-31 MED ORDER — PHENYLEPHRINE HCL 10 MG/ML IJ SOLN
10000.0000 ug | INTRAMUSCULAR | Status: DC | PRN
  Administered 2015-05-31: 40 ug/min via INTRAVENOUS

## 2015-05-31 MED ORDER — NIACIN ER (ANTIHYPERLIPIDEMIC) 500 MG PO TBCR: 1500.0000 mg | EXTENDED_RELEASE_TABLET | Freq: Every day | ORAL | Status: DC

## 2015-05-31 MED ORDER — CELECOXIB 200 MG PO CAPS
200.0000 mg | ORAL_CAPSULE | Freq: Two times a day (BID) | ORAL | Status: DC
  Administered 2015-05-31 – 2015-06-02 (×5): 200 mg via ORAL
  Filled 2015-05-31 (×5): qty 1

## 2015-05-31 MED ORDER — ENOXAPARIN SODIUM 30 MG/0.3ML ~~LOC~~ SOLN
30.0000 mg | Freq: Two times a day (BID) | SUBCUTANEOUS | Status: DC
  Administered 2015-06-01 – 2015-06-02 (×3): 30 mg via SUBCUTANEOUS
  Filled 2015-05-31 (×3): qty 0.3

## 2015-05-31 MED ORDER — NIACIN ER 500 MG PO CPCR
1000.0000 mg | ORAL_CAPSULE | Freq: Every day | ORAL | Status: DC
  Administered 2015-05-31 – 2015-06-01 (×2): 1000 mg via ORAL
  Filled 2015-05-31 (×3): qty 2

## 2015-05-31 MED ORDER — MIDAZOLAM HCL 5 MG/5ML IJ SOLN
INTRAMUSCULAR | Status: DC | PRN
  Administered 2015-05-31: 2 mg via INTRAVENOUS

## 2015-05-31 MED ORDER — PHENYLEPHRINE HCL 10 MG/ML IJ SOLN
INTRAMUSCULAR | Status: DC | PRN
  Administered 2015-05-31: 200 ug via INTRAVENOUS

## 2015-05-31 SURGICAL SUPPLY — 57 items
AUTOTRANSFUS HAS 1/8 (MISCELLANEOUS) ×3
BATTERY INSTRU NAVIGATION (MISCELLANEOUS) ×12 IMPLANT
BLADE SAW 1 (BLADE) ×3 IMPLANT
BLADE SAW 1/2 (BLADE) ×3 IMPLANT
BONE CEMENT GENTAMICIN (Cement) ×6 IMPLANT
CANISTER SUCT 1200ML W/VALVE (MISCELLANEOUS) ×3 IMPLANT
CANISTER SUCT 3000ML (MISCELLANEOUS) ×6 IMPLANT
CAP KNEE TOTAL 3 SIGMA ×3 IMPLANT
CATH TRAY METER 16FR LF (MISCELLANEOUS) ×3 IMPLANT
CEMENT BONE GENTAMICIN 40 (Cement) ×2 IMPLANT
COOLER POLAR GLACIER W/PUMP (MISCELLANEOUS) ×3 IMPLANT
DRAPE SHEET LG 3/4 BI-LAMINATE (DRAPES) ×3 IMPLANT
DRSG DERMACEA 8X12 NADH (GAUZE/BANDAGES/DRESSINGS) ×3 IMPLANT
DRSG OPSITE POSTOP 4X14 (GAUZE/BANDAGES/DRESSINGS) ×3 IMPLANT
DRSG TEGADERM 4X4.75 (GAUZE/BANDAGES/DRESSINGS) ×3 IMPLANT
DURAPREP 26ML APPLICATOR (WOUND CARE) ×6 IMPLANT
ELECT CAUTERY BLADE 6.4 (BLADE) ×3 IMPLANT
EX-PIN ORTHOLOCK NAV 4X150 (PIN) ×6 IMPLANT
GLOVE BIOGEL M STRL SZ7.5 (GLOVE) ×6 IMPLANT
GLOVE INDICATOR 8.0 STRL GRN (GLOVE) ×3 IMPLANT
GLOVE SURG 9.0 ORTHO LTXF (GLOVE) ×3 IMPLANT
GLOVE SURG ORTHO 9.0 STRL STRW (GLOVE) ×3 IMPLANT
GOWN STRL REUS W/ TWL LRG LVL3 (GOWN DISPOSABLE) ×2 IMPLANT
GOWN STRL REUS W/ TWL LRG LVL4 (GOWN DISPOSABLE) ×1 IMPLANT
GOWN STRL REUS W/TWL 2XL LVL3 (GOWN DISPOSABLE) ×3 IMPLANT
GOWN STRL REUS W/TWL LRG LVL3 (GOWN DISPOSABLE) ×4
GOWN STRL REUS W/TWL LRG LVL4 (GOWN DISPOSABLE) ×2
HANDPIECE SUCTION TUBG SURGILV (MISCELLANEOUS) ×3 IMPLANT
HOLDER FOLEY CATH W/STRAP (MISCELLANEOUS) ×3 IMPLANT
HOOD PEEL AWAY FLYTE STAYCOOL (MISCELLANEOUS) ×6 IMPLANT
KIT RM TURNOVER STRD PROC AR (KITS) ×3 IMPLANT
KNIFE SCULPS 14X20 (INSTRUMENTS) ×3 IMPLANT
NDL SAFETY 18GX1.5 (NEEDLE) ×3 IMPLANT
NEEDLE SPNL 20GX3.5 QUINCKE YW (NEEDLE) ×3 IMPLANT
NS IRRIG 500ML POUR BTL (IV SOLUTION) ×3 IMPLANT
PACK TOTAL KNEE (MISCELLANEOUS) ×3 IMPLANT
PAD GROUND ADULT SPLIT (MISCELLANEOUS) ×3 IMPLANT
PAD WRAPON POLAR KNEE (MISCELLANEOUS) ×1 IMPLANT
PIN DRILL QUICK PACK ×3 IMPLANT
PIN FIXATION 1/8DIA X 3INL (PIN) ×3 IMPLANT
SOL .9 NS 3000ML IRR  AL (IV SOLUTION) ×2
SOL .9 NS 3000ML IRR UROMATIC (IV SOLUTION) ×1 IMPLANT
SOL PREP PVP 2OZ (MISCELLANEOUS) ×3
SOLUTION PREP PVP 2OZ (MISCELLANEOUS) ×1 IMPLANT
SPONGE DRAIN TRACH 4X4 STRL 2S (GAUZE/BANDAGES/DRESSINGS) ×3 IMPLANT
STAPLER SKIN PROX 35W (STAPLE) ×3 IMPLANT
SUCTION FRAZIER TIP 10 FR DISP (SUCTIONS) ×3 IMPLANT
SUT VIC AB 0 CT1 36 (SUTURE) ×3 IMPLANT
SUT VIC AB 1 CT1 36 (SUTURE) ×6 IMPLANT
SUT VIC AB 2-0 CT2 27 (SUTURE) ×3 IMPLANT
SYR 20CC LL (SYRINGE) ×3 IMPLANT
SYR 30ML LL (SYRINGE) ×3 IMPLANT
SYR 50ML LL SCALE MARK (SYRINGE) ×3 IMPLANT
SYSTEM AUTOTRANSFUS DUAL TROCR (MISCELLANEOUS) ×1 IMPLANT
TOWEL OR 17X26 4PK STRL BLUE (TOWEL DISPOSABLE) ×3 IMPLANT
TOWER CARTRIDGE SMART MIX (DISPOSABLE) ×3 IMPLANT
WRAPON POLAR PAD KNEE (MISCELLANEOUS) ×3

## 2015-05-31 NOTE — Transfer of Care (Signed)
Immediate Anesthesia Transfer of Care Note  Patient: Derek Parker  Procedure(s) Performed: Procedure(s): COMPUTER ASSISTED TOTAL KNEE ARTHROPLASTY (Left)  Patient Location: PACU  Anesthesia Type:General  Level of Consciousness: awake, alert , oriented and patient cooperative  Airway & Oxygen Therapy: Patient Spontanous Breathing and Patient connected to face mask oxygen  Post-op Assessment: Report given to RN and Post -op Vital signs reviewed and stable  Post vital signs: Reviewed and stable  Last Vitals:  Filed Vitals:   05/31/15 0632 05/31/15 1110  BP: 134/68 110/58  Pulse: 87 70  Temp: 36.8 C 38 C  Resp: 20 18    Complications: No apparent anesthesia complications

## 2015-05-31 NOTE — Anesthesia Preprocedure Evaluation (Addendum)
Anesthesia Evaluation  Patient identified by MRN, date of birth, ID band Patient awake    Reviewed: Allergy & Precautions, H&P , NPO status , Patient's Chart, lab work & pertinent test results, reviewed documented beta blocker date and time   Airway Mallampati: III  TM Distance: >3 FB Neck ROM: full    Dental no notable dental hx.    Pulmonary neg pulmonary ROS, former smoker,    Pulmonary exam normal breath sounds clear to auscultation       Cardiovascular Exercise Tolerance: Good hypertension, + CAD and + Past MI  negative cardio ROS   Rhythm:regular Rate:Normal     Neuro/Psych negative neurological ROS  negative psych ROS   GI/Hepatic negative GI ROS, Neg liver ROS,   Endo/Other  negative endocrine ROSdiabetes  Renal/GU negative Renal ROS  negative genitourinary   Musculoskeletal   Abdominal   Peds  Hematology negative hematology ROS (+)   Anesthesia Other Findings   Reproductive/Obstetrics negative OB ROS                            Anesthesia Physical Anesthesia Plan  ASA: III  Anesthesia Plan: General and Spinal   Post-op Pain Management:    Induction:   Airway Management Planned:   Additional Equipment:   Intra-op Plan:   Post-operative Plan:   Informed Consent: I have reviewed the patients History and Physical, chart, labs and discussed the procedure including the risks, benefits and alternatives for the proposed anesthesia with the patient or authorized representative who has indicated his/her understanding and acceptance.   Dental Advisory Given  Plan Discussed with: CRNA  Anesthesia Plan Comments:         Anesthesia Quick Evaluation

## 2015-05-31 NOTE — H&P (Signed)
The patient has been re-examined, and the chart reviewed, and there have been no interval changes to the documented history and physical.    The risks, benefits, and alternatives have been discussed at length. The patient expressed understanding of the risks benefits and agreed with plans for surgical intervention.  Rodderick Holtzer P. Smt Lokey, Jr. M.D.    

## 2015-05-31 NOTE — Brief Op Note (Signed)
05/31/2015  11:12 AM  PATIENT:  Derek Parker  74 y.o. male  PRE-OPERATIVE DIAGNOSIS:  DEGENERATIVE OSTEOARTHRITIS of the left knee  POST-OPERATIVE DIAGNOSIS:  Same  PROCEDURE:  Procedure(s): COMPUTER ASSISTED TOTAL KNEE ARTHROPLASTY (Left)  SURGEON:  Surgeon(s) and Role:    * [name], MD - Primary  ASSISTANTS: Vance Peper, PA   ANESTHESIA:   spinal  EBL:  Total I/O In: 1300 [I.V.:1300] Out: 425 [Urine:325; Blood:100]  BLOOD ADMINISTERED:none  DRAINS: 2 medium drains to a reinfusion system   LOCAL MEDICATIONS USED:  MARCAINE    and OTHER Exparel  SPECIMEN:  No Specimen  DISPOSITION OF SPECIMEN:  N/A  COUNTS:  YES  TOURNIQUET: 117 minutes  DICTATION: .Dragon Dictation  PLAN OF CARE: Admit to inpatient   PATIENT DISPOSITION:  PACU - hemodynamically stable.   Delay start of Pharmacological VTE agent (>24hrs) due to surgical blood loss or risk of bleeding: yes

## 2015-05-31 NOTE — Progress Notes (Signed)
Pain managed with pain medicine oxycodone and morphine. Pt did get up with PT today

## 2015-05-31 NOTE — Anesthesia Procedure Notes (Signed)
Spinal Patient location during procedure: OR Start time: 05/31/2015 7:26 AM Staffing Resident/CRNA: Alda Berthold Performed by: resident/CRNA  Preanesthetic Checklist Completed: patient identified, site marked, surgical consent, pre-op evaluation, timeout performed, IV checked, risks and benefits discussed and monitors and equipment checked Spinal Block Patient position: sitting Prep: ChloraPrep Patient monitoring: continuous pulse ox, blood pressure and cardiac monitor Approach: midline Location: L3-4 Injection technique: single-shot Needle Needle type: Whitacre  Needle gauge: 25 G Needle length: 9 cm Assessment Sensory level: T6

## 2015-05-31 NOTE — Progress Notes (Signed)
Pt is insisting that he is a DNR due to stage 4 prostate cancer.  Dr Marry Guan ordered a DNR

## 2015-05-31 NOTE — Op Note (Signed)
OPERATIVE NOTE  DATE OF SURGERY:  05/31/2015  PATIENT NAME:  Derek Parker   DOB: 1941-06-05  MRN: [id number]  PRE-OPERATIVE DIAGNOSIS: Degenerative arthrosis of the left knee, primary  POST-OPERATIVE DIAGNOSIS:  Same  PROCEDURE:  Left total knee arthroplasty using computer-assisted navigation  SURGEON:  Marciano Sequin. M.D.  ASSISTANT:  Vance Peper, PA (present and scrubbed throughout the case, critical for assistance with exposure, retraction, instrumentation, and closure)  ANESTHESIA: spinal  ESTIMATED BLOOD LOSS: 100 mL  FLUIDS REPLACED: 1300 mL of crystalloid  TOURNIQUET TIME: 117 minutes  DRAINS: 2 medium drains to a reinfusion system  SOFT TISSUE RELEASES: Anterior cruciate ligament, posterior cruciate ligament, deep and superficial medial collateral ligament, patellofemoral ligament   IMPLANTS UTILIZED: DePuy PFC Sigma size 4 posterior stabilized femoral component (cemented), size 5 MBT tibial component (cemented), 41 mm 3 peg oval dome patella (cemented), and a 10 mm stabilized rotating platform polyethylene insert.  INDICATIONS FOR SURGERY: AMONDRE LASEK is a 74 y.o. year old male with a long history of progressive knee pain. X-rays demonstrated severe degenerative changes in tricompartmental fashion. The patient had not seen any significant improvement despite conservative nonsurgical intervention. After discussion of the risks and benefits of surgical intervention, the patient expressed understanding of the risks benefits and agree with plans for total knee arthroplasty.   The risks, benefits, and alternatives were discussed at length including but not limited to the risks of infection, bleeding, nerve injury, stiffness, blood clots, the need for revision surgery, cardiopulmonary complications, among others, and they were willing to proceed.  PROCEDURE IN DETAIL: The patient was brought into the operating room and, after adequate spinal anesthesia was achieved, a  tourniquet was placed on the patient's upper thigh. The patient's knee and leg were cleaned and prepped with alcohol and DuraPrep and draped in the usual sterile fashion. A "timeout" was performed as per usual protocol. The lower extremity was exsanguinated using an Esmarch, and the tourniquet was inflated to 300 mmHg. An anterior longitudinal incision was made followed by a standard mid vastus approach. The deep fibers of the medial collateral ligament were elevated in a subperiosteal fashion off of the medial flare of the tibia so as to maintain a continuous soft tissue sleeve. The patella was subluxed laterally and the patellofemoral ligament was incised. Inspection of the knee demonstrated severe degenerative changes with full-thickness loss of articular cartilage. Osteophytes were debrided using a rongeur. Anterior and posterior cruciate ligaments were excised. Two 4.0 mm Schanz pins were inserted in the femur and into the tibia for attachment of the array of trackers used for computer-assisted navigation. Hip center was identified using a circumduction technique. Distal landmarks were mapped using the computer. The distal femur and proximal tibia were mapped using the computer. The distal femoral cutting guide was positioned using computer-assisted navigation so as to achieve a 5 distal valgus cut. The femur was sized and it was felt that a size 4 femoral component was appropriate. A size 4 femoral cutting guide was positioned and the anterior cut was performed and verified using the computer. This was followed by completion of the posterior and chamfer cuts. Femoral cutting guide for the central box was then positioned in the center box cut was performed.  Attention was then directed to the proximal tibia. Medial and lateral menisci were excised. The extramedullary tibial cutting guide was positioned using computer-assisted navigation so as to achieve a 0 varus-valgus alignment and 0 posterior slope. The  cut was performed  and verified using the computer. The proximal tibia was sized and it was felt that a size 5 tibial tray was appropriate. Tibial and femoral trials were inserted followed by insertion of a 10 mm polyethylene insert. The knee was felt to be tight in extension but well balanced in flexion. The trial components were removed and an additional 2 mm of bone was resected from the distal femur. Appropriate cutting guides were placed for recutting of the chamfers. Trial components were reinserted This allowed for excellent mediolateral soft tissue balancing both in flexion and in full extension. Finally, the patella was cut and prepared so as to accommodate a 41 mm 3 peg oval dome patella. A patella trial was placed and the knee was placed through a range of motion with excellent patellar tracking appreciated. The femoral trial was removed after debridement of posterior osteophytes. The central post-hole for the tibial component was reamed followed by insertion of a keel punch. Tibial trials were then removed. Cut surfaces of bone were irrigated with copious amounts of normal saline with antibiotic solution using pulsatile lavage and then suctioned dry. Polymethylmethacrylate cement with gentamicin was prepared in the usual fashion using a vacuum mixer. Cement was applied to the cut surface of the proximal tibia as well as along the undersurface of a size 5 MBT tibial component. Tibial component was positioned and impacted into place. Excess cement was removed using [employer] fast streamer. Cement was then applied to the cut surfaces of the femur as well as along the posterior flanges of the size 4 femoral component. The femoral component was positioned and impacted into place. Excess cement was removed using [employer] fast streamer. A 10 mm polyethylene trial was inserted and the knee was brought into full extension with steady axial compression applied. Finally, cement was applied to the backside of a 41 mm 3 peg oval  dome patella and the patellar component was positioned and patellar clamp applied. Excess cement was removed using [employer] fast streamer. After adequate curing of the cement, the tourniquet was deflated after a total tourniquet time of 117 minutes. Hemostasis was achieved using electrocautery. The knee was irrigated with copious amounts of normal saline with antibiotic solution using pulsatile lavage and then suctioned dry. 20 mL of 1.3% Exparel in 40 mL of normal saline was injected along the posterior capsule, medial and lateral gutters, and along the arthrotomy site. A 10 mm stabilized rotating platform polyethylene insert was inserted and the knee was placed through a range of motion with excellent mediolateral soft tissue balancing appreciated and excellent patellar tracking noted. 2 medium drains were placed in the wound bed and brought out through separate stab incisions to be attached to a reinfusion system. The medial parapatellar portion of the incision was reapproximated using interrupted sutures of #1 Vicryl. Subcutaneous tissue was then injected with a total of 30 cc of 0.25% Marcaine with epinephrine. Subcutaneous tissue was approximated in layers using first #0 Vicryl followed #2-0 Vicryl. The skin was approximated with skin staples. A sterile dressing was applied.  The patient tolerated the procedure well and was transported to the recovery room in stable condition.    James P. Holley Bouche., M.D.

## 2015-05-31 NOTE — Evaluation (Signed)
Physical Therapy Evaluation Patient Details Name: Derek Parker MRN: [id number] DOB: Oct 20, 1940 Today's Date: 05/31/2015   History of Present Illness  Pt admitted for L TKR on 05/31/2015  Clinical Impression  Pt is a pleasant 74 year old male who was admitted for L TKR. Pt performs bed mobility, transfers, and ambulation with cga and rw. Pt demonstrates ability to perform 10 SLRs with independence, therefore does not require KI at this time.  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 Pt very motivated to perform therapy. Pt demonstrates deficits with strength/pain/mobility at this time.  Would benefit from skilled PT to address above deficits and promote optimal return to PLOF. All mobility performed on room air with sats at 94%. No SOB symptoms noted.      Follow Up Recommendations Home health PT    Equipment Recommendations       Recommendations for Other Services       Precautions / Restrictions Precautions Precautions: Knee Precaution Booklet Issued: No Restrictions Weight Bearing Restrictions: Yes LLE Weight Bearing: Weight bearing as tolerated      Mobility  Bed Mobility Overal bed mobility: Needs Assistance Bed Mobility: Supine to Sit           General bed mobility comments: bed mobility performed with cga and cues for correct technique. Assistance required for L LE, lowering to ground  Transfers Overall transfer level: Needs assistance Equipment used: Rolling walker (2 wheeled) Transfers: Sit to/from Stand Sit to Stand: Min guard         General transfer comment: sit<>Stand with cga and rw. Safe technique performed with cues for hand placement  Ambulation/Gait Ambulation/Gait assistance: Min guard Ambulation Distance (Feet): 5 Feet Assistive device: Rolling walker (2 wheeled) Gait Pattern/deviations: Step-to pattern     General Gait Details: ambulated using step to gait pattern. Increase in pain noted with ambulation. Safe technique performed.    Stairs            Wheelchair Mobility    Modified Rankin (Stroke Patients Only)       Balance Overall balance assessment: Needs assistance Sitting-balance support: Bilateral upper extremity supported Sitting balance-Leahy Scale: Good     Standing balance support: Bilateral upper extremity supported Standing balance-Leahy Scale: Good                               Pertinent Vitals/Pain Pain Assessment: 0-10 Pain Score: 7  Pain Location: L knee Pain Descriptors / Indicators: Constant;Operative site guarding Pain Intervention(s): Limited activity within patient's tolerance;Premedicated before session;Ice applied    Home Living Family/patient expects to be discharged to:: Private residence Living Arrangements: Spouse/significant other Available Help at Discharge: Family Type of Home: House Home Access: Stairs to enter Entrance Stairs-Rails: Right Entrance Stairs-Number of Steps: 5 Home Layout: One level Home Equipment: Environmental consultant - 2 wheels      Prior Function Level of Independence: Independent               Hand Dominance        Extremity/Trunk Assessment   Upper Extremity Assessment: Overall WFL for tasks assessed           Lower Extremity Assessment: LLE deficits/detail   LLE Deficits / Details: grossly 3/5 evident by ability to perform SLR without assistance     Communication   Communication: No difficulties  Cognition Arousal/Alertness: Awake/alert Behavior During Therapy: WFL for tasks assessed/performed Overall Cognitive Status: Within Functional Limits for tasks  assessed                      General Comments      Exercises Total Joint Exercises Goniometric ROM: L knee AAROM: 2-65 degrees Other Exercises Other Exercises: Pt performed supine ther-ex including L ankle pumps, quad sets, SLRs, and hip abd/add. All ther-ex performed x 10 reps with cues for correct technique.      Assessment/Plan    PT  Assessment Patient needs continued PT services  PT Diagnosis Difficulty walking;Abnormality of gait;Generalized weakness;Acute pain   PT Problem List Decreased strength;Decreased range of motion;Decreased activity tolerance;Decreased mobility;Pain  PT Treatment Interventions Gait training;Therapeutic exercise   PT Goals (Current goals can be found in the Care Plan section) Acute Rehab PT Goals Patient Stated Goal: to go home PT Goal Formulation: With patient Time For Goal Achievement: 06/14/15 Potential to Achieve Goals: Good    Frequency BID   Barriers to discharge        Co-evaluation               End of Session Equipment Utilized During Treatment: Gait belt Activity Tolerance: Patient tolerated treatment well Patient left: in chair;with chair alarm set Nurse Communication: Mobility status         Time: [id number] PT Time Calculation (min) (ACUTE ONLY): 31 min   Charges:   PT Evaluation $Initial PT Evaluation Tier I: 1 Procedure PT Treatments $Therapeutic Exercise: 8-22 mins   PT G Codes:        Lynnel Zanetti 21-Jun-2015, 3:40 PM  Greggory Stallion, PT, DPT [phone]

## 2015-05-31 NOTE — Progress Notes (Signed)
Pt is up in the chair.  He was able to work with physical therapy

## 2015-06-01 LAB — GLUCOSE, CAPILLARY
GLUCOSE-CAPILLARY: 119 mg/dL — AB (ref 65–99)
GLUCOSE-CAPILLARY: 153 mg/dL — AB (ref 65–99)
GLUCOSE-CAPILLARY: 129 mg/dL — AB (ref 65–99)
Glucose-Capillary: 215 mg/dL — ABNORMAL HIGH (ref 65–99)

## 2015-06-01 LAB — BASIC METABOLIC PANEL
ANION GAP: 5 (ref 5–15)
BUN: 20 mg/dL (ref 6–20)
CALCIUM: 8.1 mg/dL — AB (ref 8.9–10.3)
CHLORIDE: 107 mmol/L (ref 101–111)
CO2: 25 mmol/L (ref 22–32)
CREATININE: 0.98 mg/dL (ref 0.61–1.24)
GFR calc non Af Amer: >60 mL/min (ref >60)
Glucose, Bld: 115 mg/dL — ABNORMAL HIGH (ref 65–99)
Potassium: 4.9 mmol/L (ref 3.5–5.1)
SODIUM: 137 mmol/L (ref 135–145)

## 2015-06-01 LAB — CBC
HEMATOCRIT: 25.2 % — AB (ref 40.0–52.0)
HEMOGLOBIN: 8.4 g/dL — AB (ref 13.0–18.0)
MCH: 27.4 pg (ref 26.0–34.0)
MCHC: 33.2 g/dL (ref 32.0–36.0)
MCV: 82.4 fL (ref 80.0–100.0)
Platelets: 199 10*3/uL (ref 150–440)
RBC: 3.06 MIL/uL — ABNORMAL LOW (ref 4.40–5.90)
RDW: 14.9 % — ABNORMAL HIGH (ref 11.5–14.5)
WBC: 5.7 10*3/uL (ref 3.8–10.6)

## 2015-06-01 MED ORDER — TRAMADOL HCL 50 MG PO TABS: 50.0000 mg | ORAL_TABLET | ORAL | Status: AC | PRN

## 2015-06-01 MED ORDER — OXYCODONE HCL 5 MG PO TABS: 5.0000 mg | ORAL_TABLET | ORAL | Status: AC | PRN

## 2015-06-01 MED ORDER — ENOXAPARIN SODIUM 40 MG/0.4ML ~~LOC~~ SOLN: 40.0000 mg | SUBCUTANEOUS | Status: DC

## 2015-06-01 NOTE — Care Management Note (Addendum)
Case Management Note  Patient Details  Name: Derek Parker MRN: [id number] Date of Birth: Mar 30, 1941  Subjective/Objective:                  Met with patient to discuss discharge planning. He would like to return home with his wife. He wants to talk to her about home health agency prior to selecting. He has a rolling walker at home. He denies need for bedside commode. He uses [facility] [address] For Rx [phone]. Action/Plan: List of home health agencies left with patient. Lovenox 45m #14 [facility] for price. RNCM will continue to follow.   Expected Discharge Date:  06/03/15               Expected Discharge Plan:     In-House Referral:     Discharge planning Services  CM Consult  Post Acute Care Choice:  Home Health Choice offered to:  Patient  DME Arranged:    DME Agency:     HH Arranged:    HGilliamAgency:     Status of Service:  In process, will continue to follow  Medicare Important Message Given:    Date Medicare IM Given:    Medicare IM give by:    Date Additional Medicare IM Given:    Additional Medicare Important Message give by:     If discussed at [place] Meetings, dates discussed:    Additional Comments: Lovenox $21.15.  AMarshell Garfinkel RN 06/01/2015, 11:27 AM

## 2015-06-01 NOTE — Care Management (Signed)
Received call from patient's wife request Stacy with [facility] for HHPT. I have made referral to [facility] with [place] care.

## 2015-06-01 NOTE — Discharge Summary (Signed)
Physician Discharge Summary  Patient ID: Derek Parker MRN: [id number] DOB/AGE: September 17, 1940 74 y.o.  Admit date: 05/31/2015 Discharge date:   Admission Diagnoses:  DEGENERATIVE OSTEOARTHRITIS    Discharge Diagnoses: Patient Active Problem List   Diagnosis Date Noted  . 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 Prostate cancer ([place])     metastatic to bone  . Hypercholesteremia   . CAD (coronary artery disease)   . Neuropathy, diabetic ([place])   . Diabetes mellitus without complication ([place])   . HTN (hypertension)   . Myocardial infarction ([place])   . GERD (gastroesophageal reflux disease)   . Neuropathy ([place])      Transfusion: Autovac transfusion given first 6 hours postoperatively   Consultants (if any):   case management for home health assistance  Discharged Condition: Improved  Hospital Course: Derek Parker is an 74 y.o. male who was admitted 05/31/2015 with a diagnosis of degenerative arthrosis left knee and went to the operating room on 05/31/2015 and underwent the above named procedures.    Surgeries:Procedure(s): COMPUTER ASSISTED TOTAL KNEE ARTHROPLASTY on 05/31/2015  PRE-OPERATIVE DIAGNOSIS: Degenerative arthrosis of the left knee, primary  POST-OPERATIVE DIAGNOSIS: Same  PROCEDURE: Left total knee arthroplasty using computer-assisted navigation  SURGEON: Marciano Sequin. M.D.  ASSISTANT: Vance Peper, PA (present and scrubbed throughout the case, critical for assistance with exposure, retraction, instrumentation, and closure)  ANESTHESIA: spinal  ESTIMATED BLOOD LOSS: 100 mL  FLUIDS REPLACED: 1300 mL of crystalloid  TOURNIQUET TIME: 117 minutes  DRAINS: 2 medium drains to a reinfusion system  SOFT TISSUE RELEASES: Anterior cruciate ligament, posterior cruciate ligament, deep and superficial medial collateral ligament, patellofemoral ligament   IMPLANTS UTILIZED: DePuy PFC Sigma size 4 posterior  stabilized femoral component (cemented), size 5 MBT tibial component (cemented), 41 mm 3 peg oval dome patella (cemented), and a 10 mm stabilized rotating platform polyethylene insert.  INDICATIONS FOR SURGERY: Derek Parker is a 74 y.o. year old male with a long history of progressive knee pain. X-rays demonstrated severe degenerative changes in tricompartmental fashion. The patient had not seen any significant improvement despite conservative nonsurgical intervention. After discussion of the risks and benefits of surgical intervention, the patient expressed understanding of the risks benefits and agree with plans for total knee arthroplasty.   The risks, benefits, and alternatives were discussed at length including but not limited to the risks of infection, bleeding, nerve injury, stiffness, blood clots, the need for revision surgery, cardiopulmonary complications, among others, and they were willing to proceed. Patient tolerated the surgery well. No complications .Patient was taken to PACU where she was stabilized and then transferred to the orthopedic floor.  Patient started on Lovenox 30 q 12 hrs. Foot pumps applied bilaterally at 80 mm hg. Heels elevated off bed with rolled towels. No evidence of DVT. Calves non tender. Negative Homan. Physical therapy started on day #1 for gait training and transfer with OT starting on  day #1 for ADL and assisted devices. Patient has done well with therapy. Ambulated 200 feet upon being discharged. Was able to go up 4 steps independently and safely  Patient's IV and Foley were discontinued on day #1 with the  hemovac being d/c on day #2.   He was given perioperative antibiotics:  Anti-infectives    Start     Dose/Rate Route Frequency Ordered Stop   05/31/15 1315  ceFAZolin (ANCEF) IVPB 2 g/50 mL premix     2 g  100 mL/hr over 30 Minutes Intravenous Every 6 hours 05/31/15 1302 06/01/15 1314   05/31/15 0600  ceFAZolin (ANCEF) IVPB 2 g/50 mL premix     2  g 100 mL/hr over 30 Minutes Intravenous  Once 05/31/15 0558 05/31/15 0729   05/31/15 0548  ceFAZolin (ANCEF) 2-3 GM-% IVPB SOLR    Comments:  LEWIS, CINDY: cabinet override      05/31/15 0548 05/31/15 1759    .  He was fitted with AV 1 compression foot pump devices, early ambulation, instructed on heel pumps and TED stockings for DVT prophylaxis.  He benefited maximally from the hospital stay and there were no complications.    Recent vital signs:  Filed Vitals:   05/31/15 2112 06/01/15 0504  BP: 130/70 115/62  Pulse:  74  Temp:  98.4 F (36.9 C)  Resp:  19    Recent laboratory studies:  Lab Results  Component Value Date   HGB 8.4* 06/01/2015   HGB 11.2* 05/12/2015   HGB 11.9* 04/05/2015   Lab Results  Component Value Date   WBC 5.7 06/01/2015   PLT 199 06/01/2015   Lab Results  Component Value Date   INR 1.10 05/12/2015   Lab Results  Component Value Date   NA 137 06/01/2015   K 4.9 06/01/2015   CL 107 06/01/2015   CO2 25 06/01/2015   BUN 20 06/01/2015   CREATININE 0.98 06/01/2015   GLUCOSE 115* 06/01/2015    Discharge Medications:     Medication List    STOP taking these medications        clopidogrel 75 MG tablet  Commonly known as:  PLAVIX     oxyCODONE-acetaminophen 5-325 MG tablet  Commonly known as:  PERCOCET/ROXICET      TAKE these medications        ALEVE PO  Take 1 tablet by mouth 2 (two) times daily as needed (for moderate pain).     alfuzosin 10 MG 24 hr tablet  Commonly known as:  UROXATRAL  Take 1 tablet by mouth daily.     Calcium + D3 600-200 MG-UNIT Tabs  Take 1 tablet by mouth 2 (two) times daily before a meal.     CO Q 10 PO  Take 1 capsule by mouth daily.     dexlansoprazole 60 MG capsule  Commonly known as:  DEXILANT  Take 1 capsule by mouth daily.     enoxaparin 40 MG/0.4ML injection  Commonly known as:  LOVENOX  Inject 0.4 mLs (40 mg total) into the skin daily.     Melatonin 5 MG Caps  Start 1 cap nightly.  Can increase to 2 tabs nightly if needed after a week or so.     metoprolol 50 MG tablet  Commonly known as:  LOPRESSOR  Take 75 mg by mouth 2 (two) times daily.     MULTIVITAMIN ADULT PO  Take 1 tablet by mouth daily.     niacin 500 MG CR tablet  Commonly known as:  NIASPAN  Take 1,500 mg by mouth daily. 1 tablet every morning and 2 tablets at bedtime     oxyCODONE 5 MG immediate release tablet  Commonly known as:  Oxy IR/ROXICODONE  Take 1-2 tablets (5-10 mg total) by mouth every 4 (four) hours as needed for severe pain or breakthrough pain.     rosuvastatin 20 MG tablet  Commonly known as:  CRESTOR  Take 20 mg by mouth daily.     solifenacin 5 MG tablet  Commonly known as:  VESICARE  Take 1 tablet by mouth daily.     traMADol 50 MG tablet  Commonly known as:  ULTRAM  Take 1-2 tablets (50-100 mg total) by mouth every 4 (four) hours as needed for moderate pain.     venlafaxine XR 75 MG 24 hr capsule  Commonly known as:  EFFEXOR-XR  Take 3 capsules by mouth daily.     Vitamin D3 2000 UNITS capsule  Take 5,000 Units by mouth daily.        Diagnostic Studies: Dg Knee Left Port  05/31/2015  CLINICAL DATA:  Postop left knee replacement EXAM: PORTABLE LEFT KNEE - 1-2 VIEW COMPARISON:  MRI 05/14/2009 FINDINGS: Total knee arthroplasty without periprosthetic fracture or dislocation. Recent surgery with skin staples, drain, and soft tissue gas. Extensive atherosclerosis. IMPRESSION: No acute finding after total knee arthroplasty. Electronically Signed   By: Monte Fantasia M.D.   On: 05/31/2015 12:19    Disposition: 01-Home or Self Care      Discharge Instructions    Diet - low sodium heart healthy    Complete by:  As directed      Increase activity slowly    Complete by:  As directed            Follow-up Information    Follow up with Feliberto Gottron, PA-C On 06/15/2015.   Specialties:  Orthopedic Surgery, Emergency Medicine   Why:  at 10:15am   Contact  information:   [place] [address] [phone]       Follow up with [name], MD On 07/13/2015.   Specialty:  Orthopedic Surgery   Why:  at 9:45am       Signed: Nature Kueker R. 06/01/2015, 7:29 AM

## 2015-06-01 NOTE — Discharge Instructions (Signed)

## 2015-06-01 NOTE — Progress Notes (Signed)
Physical Therapy Treatment Patient Details Name: Derek Parker MRN: [id number] DOB: Jan 13, 1941 Today's Date: 06/01/2015    History of Present Illness Pt admitted for L TKR on 05/31/2015    PT Comments    Pt demonstrating good safety with mobility. Progressing ambulation distance. active assisted range of motion left knee progressing; bandages limiting. Pain well controlled. Pt received up in chair comfortably; encouraged to perform ankle pumps and quad sets throughout the day. Plan to see pt this afternoon to continue strengthening, range of motion of the left knee and all functional mobility. Pt will need to perform steps before discharging home.   Follow Up Recommendations  Home health PT     Equipment Recommendations       Recommendations for Other Services       Precautions / Restrictions Precautions Precautions: Knee Precaution Booklet Issued: No Restrictions Weight Bearing Restrictions: Yes LLE Weight Bearing: Weight bearing as tolerated    Mobility  Bed Mobility Overal bed mobility: Needs Assistance Bed Mobility: Supine to Sit     Supine to sit: Min assist (for LEs)     General bed mobility comments: assist only to lower LLE to floor  Transfers Overall transfer level: Needs assistance Equipment used: Rolling walker (2 wheeled) Transfers: Sit to/from Stand Sit to Stand: Min guard         General transfer comment: Good safety  Ambulation/Gait Ambulation/Gait assistance: Min guard Ambulation Distance (Feet): 50 Feet Assistive device: Rolling walker (2 wheeled) Gait Pattern/deviations: Step-to pattern;Decreased stance time - left;Decreased dorsiflexion - left;Decreased weight shift to left;Antalgic Gait velocity: Reduced Gait velocity interpretation: <1.8 ft/sec, indicative of risk for recurrent falls General Gait Details: L knee flexion limited by bandages more so at this time.    Stairs            Wheelchair Mobility    Modified Rankin  (Stroke Patients Only)       Balance                                    Cognition Arousal/Alertness: Awake/alert Behavior During Therapy: WFL for tasks assessed/performed Overall Cognitive Status: Within Functional Limits for tasks assessed                      Exercises Total Joint Exercises Ankle Circles/Pumps: AROM;Both;20 reps (long sit) Quad Sets: Strengthening;Both;20 reps;Supine (gravity assisted on L) Knee Flexion: AAROM;Left;10 reps;Seated (3 positions each rep for stretch) Goniometric ROM: 4-66 degrees (AAROM)    General Comments        Pertinent Vitals/Pain Pain Assessment: 0-10 Pain Score: 0-No pain (since taking medication) Pain Location: L knee    Home Living                      Prior Function            PT Goals (current goals can now be found in the care plan section) Progress towards PT goals: Progressing toward goals    Frequency  BID    PT Plan Current plan remains appropriate    Co-evaluation             End of Session Equipment Utilized During Treatment: Gait belt Activity Tolerance: Patient tolerated treatment well Patient left: in chair;with chair alarm set;with SCD's reapplied;with call bell/phone within reach (polar care in place)     Time: [id number] PT Time Calculation (min) (ACUTE ONLY):  35 min  Charges:  $Gait Training: 8-22 mins $Therapeutic Exercise: 8-22 mins                    G Codes:      Derek Parker 06/01/2015, 10:32 AM

## 2015-06-01 NOTE — Evaluation (Signed)
Occupational Therapy Evaluation Patient Details Name: Derek Parker MRN: [id number] DOB: 1941/05/26 Today's Date: 06/01/2015    History of Present Illness This patient is a 74 year old male who came to [facility] for a L TKR.   Clinical Impression   This patient is a 74 year old male who came to [facility] for a L total knee replacement.  Patient lives in a 2 story home with his wife, but only lives on 1st floor.  He had been independent with ADL and functional mobility and works as a Designer, television/film set. He now requires some assistance and would benefit from Occupational Therapy for ADL/functioal mobility training.      Follow Up Recommendations       Equipment Recommendations       Recommendations for Other Services       Precautions / Restrictions Precautions Precautions: Knee Precaution Booklet Issued: No Restrictions Weight Bearing Restrictions: Yes LLE Weight Bearing: Weight bearing as tolerated      Mobility Bed Mobility  Transfers             Balance                                            ADL                                         General ADL Comments: Had been independent . He now needs assist. Practiced techniques for lower body dressing using hip kit. Patient Donned/doffed socks and pants to knees (drain still in place).  Patient needed minimal assist and verbal cues for technique and safety.      Vision     Perception     Praxis      Pertinent Vitals/Pain Pain Assessment: 0-10 Pain Score: 1  Pain Location: L knee     Hand Dominance     Extremity/Trunk Assessment Upper Extremity Assessment Upper Extremity Assessment: Overall WFL for tasks assessed   Lower Extremity Assessment Lower Extremity Assessment: Defer to PT evaluation       Communication Communication Communication: No difficulties   Cognition Arousal/Alertness:  Awake/alert Behavior During Therapy: WFL for tasks assessed/performed Overall Cognitive Status: Within Functional Limits for tasks assessed                     General Comments       Exercises Exercises: Total Joint;Other exercises     Shoulder Instructions      Home Living Family/patient expects to be discharged to:: Private residence Living Arrangements: Spouse/significant other Available Help at Discharge: Family Type of Home: House Home Access: Stairs to enter Technical brewer of Steps: 5 Entrance Stairs-Rails: Right Home Layout:  (2 story but lives on first floor)               Home Equipment: Gilford Rile - 2 wheels          Prior Functioning/Environment Level of Independence: Independent        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    OT Diagnosis: Acute pain   OT Problem List:     OT Treatment/Interventions: Self-care/ADL training    OT Goals(Current goals can be found in the care plan section) Acute  Rehab OT Goals Patient Stated Goal: to go home OT Goal Formulation: With patient Time For Goal Achievement: 06/15/15 Potential to Achieve Goals: Good  OT Frequency: Min 1X/week   Barriers to D/C:            Co-evaluation              End of Session Equipment Utilized During Treatment:  (Hip kit)  Activity Tolerance:   Patient left: in chair;with call bell/phone within reach;with chair alarm set;with family/visitor present   Time: 7858-8502 OT Time Calculation (min): 23 min Charges:  OT General Charges $OT Visit: 1 Procedure OT Evaluation $Initial OT Evaluation Tier I: 1 Procedure OT Treatments $Self Care/Home Management : 8-22 mins G-Codes:    Myrene Galas, MS/OTR/L  06/01/2015, 11:54 AM

## 2015-06-01 NOTE — Progress Notes (Signed)
Physical Therapy Treatment Patient Details Name: Derek Parker MRN: [id number] DOB: [birth year] Today's Date: 06/01/2015    History of Present Illness Pt admitted for L TKR on 05/31/2015    PT Comments    Pt remained in chair all morning/into afternoon. Pain mildly increased and ready to walk/return to bed. Discussed performing stairs tomorrow in the morning as tolerated. Pt progressing ambulation distance nicely this afternoon. Performed stretching exercises with education provided to pt again and spouse and daughter who were present. Pt requires very Min assist to return to bed and use of trapeze for repositioning upward in bed. Continue PT for progression of active assisted range of motion, strength, gait quality and distance and stairs to improve functional mobility and allow for safe return home.   Follow Up Recommendations  Home health PT     Equipment Recommendations       Recommendations for Other Services       Precautions / Restrictions Precautions Precautions: Knee Precaution Booklet Issued: No Restrictions Weight Bearing Restrictions: Yes LLE Weight Bearing: Weight bearing as tolerated    Mobility  Bed Mobility Overal bed mobility: Needs Assistance Bed Mobility: Sit to Supine     Supine to sit:  (for LEs) Sit to supine: Min assist   General bed mobility comments: assist for LLE only  Transfers Overall transfer level: Needs assistance Equipment used: Rolling walker (2 wheeled) Transfers: Sit to/from Stand Sit to Stand: Min guard         General transfer comment: Good safety  Ambulation/Gait Ambulation/Gait assistance: Min guard Ambulation Distance (Feet): 110 Feet Assistive device: Rolling walker (2 wheeled) Gait Pattern/deviations: Step-to pattern;Decreased stance time - left;Decreased step length - right;Decreased dorsiflexion - left;Decreased weight shift to left (decreased knee flexion left) Gait velocity: Reduced Gait velocity interpretation:  <1.8 ft/sec, indicative of risk for recurrent falls General Gait Details: L knee flexion limited by bandages more so at this time.    Stairs            Wheelchair Mobility    Modified Rankin (Stroke Patients Only)       Balance                                    Cognition Arousal/Alertness: Awake/alert Behavior During Therapy: WFL for tasks assessed/performed Overall Cognitive Status: Within Functional Limits for tasks assessed                      Exercises Total Joint Exercises Ankle Circles/Pumps: AROM;Both;20 reps (long sit) Quad Sets: Strengthening;Both;20 reps;Supine (gravity assisted on L) Knee Flexion: AAROM;Left;10 reps;Seated (3 positions each rep for stretch)    General Comments        Pertinent Vitals/Pain Pain Assessment: 0-10 Pain Score: 3  Pain Location: L knee    Home Living Family/patient expects to be discharged to:: Private residence Living Arrangements: Spouse/significant other Available Help at Discharge: Family Type of Home: House Home Access: Stairs to enter Entrance Stairs-Rails: Right Home Layout:  (2 story but lives on first floor) Home Equipment: Environmental consultant - 2 wheels      Prior Function Level of Independence: Independent      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   PT Goals (current goals can now be found in the care plan section) Acute Rehab PT Goals Patient Stated Goal: to go home Progress towards PT goals: Progressing toward goals    Frequency  BID    PT Plan Current plan remains appropriate    Co-evaluation             End of Session Equipment Utilized During Treatment: Gait belt Activity Tolerance: Patient tolerated treatment well Patient left: with SCD's reapplied;with call bell/phone within reach;in bed;with bed alarm set;with family/visitor present (polar care in place)     Time: 1341-1415 PT Time Calculation (min) (ACUTE ONLY): 34 min  Charges:  $Gait Training: 8-22  mins $Therapeutic Exercise: 8-22 mins                    G Codes:      Derek Parker 06/01/2015, 2:38 PM

## 2015-06-01 NOTE — Progress Notes (Addendum)
   Subjective: 1 Day Post-Op Procedure(s) (LRB): COMPUTER ASSISTED TOTAL KNEE ARTHROPLASTY (Left) Patient reports pain as 3 on 0-10 scale.   Patient is well, and has had no acute complaints or problems Continue with physical and occupational therapy today.  Plan is to go Home after hospital stay. no nausea Patient denies any chest pains or shortness of breath. Objective: Vital signs in last 24 hours: Temp:  [97.5 F (36.4 C)-100.4 F (38 C)] 98.4 F (36.9 C) (12/06 0504) Pulse Rate:  [64-74] 74 (12/06 0504) Resp:  [14-19] 19 (12/06 0504) BP: (92-130)/(44-72) 115/62 mmHg (12/06 0504) SpO2:  [92 %-100 %] 99 % (12/06 0504) Patient has original dressing on and unable to evaluate the incision at this time Heels are non tender and elevated off the bed using rolled towels Portal care in place and working Patient using CPAP Intake/Output from previous day: 12/05 0701 - 12/06 0700 In: 3842.3 [P.O.:240; I.V.:3327.3; Blood:275] Out: 2295 [Urine:1525; Emesis/NG output:100; Drains:570; Blood:100] Intake/Output this shift:     Recent Labs  06/01/15 0514  HGB 8.4*    Recent Labs  06/01/15 0514  WBC 5.7  RBC 3.06*  HCT 25.2*  PLT 199    Recent Labs  06/01/15 0514  NA 137  K 4.9  CL 107  CO2 25  BUN 20  CREATININE 0.98  GLUCOSE 115*  CALCIUM 8.1*   No results for input(s): LABPT, INR in the last 72 hours.  EXAM General - Patient is Alert, Appropriate and Oriented. Lungs are clear Extremity - Neurologically intact Neurovascular intact Sensation intact distally Intact pulses distally Dorsiflexion/Plantar flexion intact  Dressing - dressing C/D/I Motor Function - intact, moving foot and toes well on exam. Has greater difficulty time doing straight-leg raise on today's visit    Past Medical History  Diagnosis Date  . Prostate cancer ([place])     metastatic to bone  . Hypercholesteremia   . CAD (coronary artery disease)   . Neuropathy, diabetic ([place])   .  Diabetes mellitus without complication ([place])   . HTN (hypertension)   . Myocardial infarction ([place])   . GERD (gastroesophageal reflux disease)   . Neuropathy ([facility])     Assessment/Plan: 1 Day Post-Op Procedure(s) (LRB): COMPUTER ASSISTED TOTAL KNEE ARTHROPLASTY (Left) Active Problems:   Total knee replacement status  Estimated body mass index is 30.24 kg/(m^2) as calculated from the following:   Height as of this encounter: 6' (1.829 m).   Weight as of this encounter: 101.152 kg (223 lb). Advance diet Up with therapy D/C IV fluids Plan for discharge tomorrow p.m.  Labs: Were reviewed DVT Prophylaxis - Lovenox, Foot Pumps and TED hose Weight-Bearing as tolerated to left leg D/C O2 and Pulse OX and try on Room Air Begin working on a bowel movement Patient needs to get around the nurse's this today. Labs in a.m.  Jillyn Ledger. Holliday [place] 06/01/2015, 7:15 AM

## 2015-06-01 NOTE — Progress Notes (Signed)
Clinical Social Worker (CSW) received SNF consult. PT is recommending home health. RN Case Manager is aware of above. Please reconsult if future social work needs arise. CSW signing off.   Ayeden Gladman Morgan, LCSWA [phone] 

## 2015-06-01 NOTE — Anesthesia Postprocedure Evaluation (Signed)
Anesthesia Post Note  Patient: Derek Parker  Procedure(s) Performed: Procedure(s) (LRB): COMPUTER ASSISTED TOTAL KNEE ARTHROPLASTY (Left)  Patient location during evaluation: PACU Anesthesia Type: Spinal and MAC Level of consciousness: awake and alert Pain management: pain level controlled Vital Signs Assessment: post-procedure vital signs reviewed and stable Respiratory status: spontaneous breathing and respiratory function stable Cardiovascular status: blood pressure returned to baseline and stable Postop Assessment: spinal receding Anesthetic complications: no    Last Vitals:  Filed Vitals:   06/01/15 0504 06/01/15 0832  BP: 115/62 111/45  Pulse: 74 77  Temp: 36.9 C 36.8 C  Resp: 19 18    Last Pain:  Filed Vitals:   06/01/15 0910  PainSc: 2                  Molli Barrows

## 2015-06-02 LAB — CBC
HEMATOCRIT: 23.7 % — AB (ref 40.0–52.0)
HEMOGLOBIN: 8.2 g/dL — AB (ref 13.0–18.0)
MCH: 28 pg (ref 26.0–34.0)
MCHC: 34.5 g/dL (ref 32.0–36.0)
MCV: 81.1 fL (ref 80.0–100.0)
Platelets: 206 10*3/uL (ref 150–440)
RBC: 2.93 MIL/uL — ABNORMAL LOW (ref 4.40–5.90)
RDW: 15.3 % — AB (ref 11.5–14.5)
WBC: 6.2 10*3/uL (ref 3.8–10.6)

## 2015-06-02 LAB — GLUCOSE, CAPILLARY
GLUCOSE-CAPILLARY: 111 mg/dL — AB (ref 65–99)
Glucose-Capillary: 126 mg/dL — ABNORMAL HIGH (ref 65–99)

## 2015-06-02 MED ORDER — LACTULOSE 10 GM/15ML PO SOLN
10.0000 g | Freq: Two times a day (BID) | ORAL | Status: DC | PRN
  Administered 2015-06-02: 10 g via ORAL
  Filled 2015-06-02: qty 30

## 2015-06-02 NOTE — Care Management (Signed)
Corene Cornea with [facility] notified of patient discharge to home today. No further RNCM needs. Case closed.

## 2015-06-02 NOTE — Progress Notes (Signed)
Physical Therapy Treatment Patient Details Name: Derek Parker MRN: [id number] DOB: Nov 19, 1940 Today's Date: 06/02/2015    History of Present Illness Pt admitted for L TKR on 05/31/2015    PT Comments    Able to initiate/complete stair training with single rail (to simulate home environment); completing with cga, fair/good L knee stability; good technique noted. Patient voicing comfort with all mobility necessary for safe discharge home; no further questions/concerns at this time.   Follow Up Recommendations  Home health PT     Equipment Recommendations       Recommendations for Other Services       Precautions / Restrictions Precautions Precautions: Fall Precaution Booklet Issued: No Restrictions Weight Bearing Restrictions: Yes LLE Weight Bearing: Weight bearing as tolerated    Mobility  Bed Mobility Overal bed mobility: Needs Assistance Bed Mobility: Supine to Sit     Supine to sit: Supervision Sit to supine: Supervision   General bed mobility comments: use of trapeze for upper body elevation  Transfers Overall transfer level: Needs assistance Equipment used: Rolling walker (2 wheeled) Transfers: Sit to/from Stand Sit to Stand: Min guard;Supervision         General transfer comment: maintains L LE anterior to BOS with all movement transitions  Ambulation/Gait Ambulation/Gait assistance: Min guard Ambulation Distance (Feet): 120 Feet Assistive device: Rolling walker (2 wheeled)   Gait velocity: 11-12 seconds for 10' walk time   General Gait Details: reciprocal stepping pattern with fair/good L LE stance time/WBing; mild L knee flexion throughout gait cycle.  Steady cadence without buckling or LOB.   Stairs Stairs: Yes Stairs assistance: Min guard Stair Management: One rail Right Number of Stairs: 4 General stair comments: step to gait pattern, min cuing for technique and consistent L TKE in modified SLS  Wheelchair Mobility    Modified  Rankin (Stroke Patients Only)       Balance                                    Cognition Arousal/Alertness: Awake/alert Behavior During Therapy: WFL for tasks assessed/performed Overall Cognitive Status: Within Functional Limits for tasks assessed                      Exercises Other Exercises Other Exercises: Additional 140' gait with RW, cga--performance as above; improving fluidity, cadence and overall comfort with gait as distance progresses Other Exercises: Verbally reviewed car transfer technique; patient voiced understanding/agreement.    General Comments        Pertinent Vitals/Pain Pain Score: 3  Pain Location: 0/10 at rest, 3/10 with WBing/mobiltiy Pain Descriptors / Indicators: Aching Pain Intervention(s): Limited activity within patient's tolerance;Repositioned;Monitored during session    Home Living                      Prior Function            PT Goals (current goals can now be found in the care plan section) Acute Rehab PT Goals Patient Stated Goal: to go home PT Goal Formulation: With patient Time For Goal Achievement: 06/14/15 Potential to Achieve Goals: Good Progress towards PT goals: Progressing toward goals    Frequency  BID    PT Plan Current plan remains appropriate    Co-evaluation             End of Session Equipment Utilized During Treatment: Gait belt Activity Tolerance:  Patient tolerated treatment well Patient left: with call bell/phone within reach;in bed;with bed alarm set     Time: 1030-1054 PT Time Calculation (min) (ACUTE ONLY): 24 min  Charges:  $Gait Training: 23-37 mins                    G Codes:       Derek Parker, PT, DPT, NCS 06/02/2015, 11:11 AM [phone]

## 2015-06-02 NOTE — Care Management Important Message (Signed)
Important Message  Patient Details  Name: Derek Parker MRN: [id number] Date of Birth: [birth year]   Medicare Important Message Given:  Yes    Juliann Pulse A Kalayla Shadden 06/02/2015, 10:08 AM

## 2015-06-02 NOTE — Progress Notes (Signed)
   Subjective: 2 Days Post-Op Procedure(s) (LRB): COMPUTER ASSISTED TOTAL KNEE ARTHROPLASTY (Left) Patient reports pain as mild.   Patient is well, and has had no acute complaints or problems Continue with physical  therapy today.  Plan is to go Home after hospital stay. no nausea and no vomiting Patient denies any chest pains or shortness of breath. Objective: Vital signs in last 24 hours: Temp:  [98.6 F (37 C)-99.4 F (37.4 C)] 98.6 F (37 C) (12/07 0725) Pulse Rate:  [78-116] 78 (12/07 0725) Resp:  [18-19] 18 (12/07 0725) BP: (127-144)/(57-67) 127/57 mmHg (12/07 0725) SpO2:  [94 %-99 %] 94 % (12/07 0725) well approximated incision Heels are non tender and elevated off the bed using rolled towels Intake/Output from previous day: 12/06 0701 - 12/07 0700 In: 1638.3 [P.O.:480; I.V.:1158.3] Out: 2490 [Urine:2400; Drains:90] Intake/Output this shift:     Recent Labs  06/01/15 0514 06/02/15 0528  HGB 8.4* 8.2*    Recent Labs  06/01/15 0514 06/02/15 0528  WBC 5.7 6.2  RBC 3.06* 2.93*  HCT 25.2* 23.7*  PLT 199 206    Recent Labs  06/01/15 0514  NA 137  K 4.9  CL 107  CO2 25  BUN 20  CREATININE 0.98  GLUCOSE 115*  CALCIUM 8.1*   No results for input(s): LABPT, INR in the last 72 hours.  EXAM General - Patient is Alert, Appropriate and Oriented Extremity - Neurologically intact Neurovascular intact Sensation intact distally Intact pulses distally Dorsiflexion/Plantar flexion intact Dressing - moderate drainage. Most drainage is from the site of hemovac Motor Function - intact, moving foot and toes well on exam.    Past Medical History  Diagnosis Date  . 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 Hypercholesteremia   . CAD (coronary artery disease)   . Neuropathy, diabetic ([place])   . Diabetes mellitus without complication ([place])   . HTN (hypertension)   . Myocardial infarction ([place])   . GERD (gastroesophageal reflux disease)   . Neuropathy  ([facility])     Assessment/Plan: 2 Days Post-Op Procedure(s) (LRB): COMPUTER ASSISTED TOTAL KNEE ARTHROPLASTY (Left) Active Problems:   Total knee replacement status  Estimated body mass index is 30.24 kg/(m^2) as calculated from the following:   Height as of this encounter: 6' (1.829 m).   Weight as of this encounter: 101.152 kg (223 lb). Up with therapy Discharge home with home health after pt does his lap and does step. Had to d/c this am 2/2 bowel movement  Labs: reviewed DVT Prophylaxis - Lovenox, Foot Pumps and TED hose Weight-Bearing as tolerated to left leg hemovac discontinued Change dressing prior to d/c  Derek Parker R. [employer] [place] 06/02/2015, 10:03 AM

## 2015-06-02 NOTE — Progress Notes (Signed)
Physical Therapy Treatment Patient Details Name: Derek Parker MRN: [id number] DOB: 10/11/1940 Today's Date: 06/02/2015    History of Present Illness Pt admitted for L TKR on 05/31/2015    PT Comments    Pt did well with LE ex's in bed and upon standing up with RW pt needing to use commode for bowel movement and unable to finish session's activities.  Plan for another PT to see pt for ambulation and stairs later this AM.   Follow Up Recommendations  Home health PT     Equipment Recommendations       Recommendations for Other Services       Precautions / Restrictions Precautions Precautions: Fall Precaution Booklet Issued: No Restrictions Weight Bearing Restrictions: Yes LLE Weight Bearing: Weight bearing as tolerated    Mobility  Bed Mobility Overal bed mobility: Needs Assistance Bed Mobility: Supine to Sit;Sit to Supine     Supine to sit: Supervision Sit to supine: Supervision   General bed mobility comments: HOB flat  Transfers Overall transfer level: Needs assistance Equipment used: Rolling walker (2 wheeled) Transfers: Sit to/from [facility] Sit to Stand: Supervision Stand pivot transfers: Supervision (transfer bed to commode)       General transfer comment: increased effort to stand  Ambulation/Gait        Stairs   Wheelchair Mobility    Modified Rankin (Stroke Patients Only)       Balance                                    Cognition Arousal/Alertness: Awake/alert Behavior During Therapy: WFL for tasks assessed/performed Overall Cognitive Status: Within Functional Limits for tasks assessed                      Exercises Total Joint Exercises Goniometric ROM: L knee extension 10 degrees short of neutral semi-supine; L knee flexion 90 degrees in sitting Performed semi-supine B LE therapeutic exercise x 10 reps:  Ankle pumps (AROM B LE's); quad sets x3 second holds (AROM B LE's); SAQ's (AROM R;  AROM L); heelslides (AROM R; AAROM L), hip abd/adduction (AROM R; AAROM L), and SLR (AROM R; AROM to AAROM L).  Pt required vc's and tactile cues for correct technique with exercises.    General Comments   Nursing cleared pt for participation in physical therapy.  Pt agreeable to PT session.      Pertinent Vitals/Pain Pain Assessment: 0-10 Pain Score: 4  Pain Location:  (0/10 at rest; 4/10 with mobility) Pain Descriptors / Indicators: Aching Pain Intervention(s): Limited activity within patient's tolerance;Monitored during session;Repositioned  Vitals stable and WFL throughout treatment session.    Home Living                      Prior Function            PT Goals (current goals can now be found in the care plan section) Acute Rehab PT Goals Patient Stated Goal: to go home PT Goal Formulation: With patient Time For Goal Achievement: 06/14/15 Potential to Achieve Goals: Good Additional Goals Additional Goal #1: Pt will be able to perform bed mobility/transfers with supervision in order to improve functional independence with rw. Progress towards PT goals: Progressing toward goals    Frequency  BID    PT Plan Current plan remains appropriate    Co-evaluation  End of Session Equipment Utilized During Treatment: Gait belt Activity Tolerance: Patient tolerated treatment well Patient left:  (on commode with NA present)     Time: [id number] PT Time Calculation (min) (ACUTE ONLY): 27 min  Charges:  $Therapeutic Exercise: 8-22 mins $Therapeutic Activity: 8-22 mins                    G CodesLeitha Bleak 05-Jun-2015, 12:43 PM Leitha Bleak, [place]

## 2015-06-19 ENCOUNTER — Emergency Department
Admission: EM | Admit: 2015-06-19 | Discharge: 2015-06-19 | Disposition: A | Discharge status: Home / Self Care | Payer: Medicare Other | Attending: Emergency Medicine | Admitting: Emergency Medicine

## 2015-06-19 ENCOUNTER — Encounter: Payer: Self-pay | Admitting: Emergency Medicine

## 2015-06-19 DIAGNOSIS — Z87891 Personal history of nicotine dependence: Secondary | ICD-10-CM | POA: Insufficient documentation

## 2015-06-19 DIAGNOSIS — I10 Essential (primary) hypertension: Secondary | ICD-10-CM | POA: Diagnosis not present

## 2015-06-19 DIAGNOSIS — R531 Weakness: Secondary | ICD-10-CM | POA: Diagnosis present

## 2015-06-19 DIAGNOSIS — R55 Syncope and collapse: Secondary | ICD-10-CM | POA: Insufficient documentation

## 2015-06-19 DIAGNOSIS — C7951 Secondary malignant neoplasm of bone: Secondary | ICD-10-CM | POA: Diagnosis not present

## 2015-06-19 DIAGNOSIS — C61 Malignant neoplasm of prostate: Secondary | ICD-10-CM | POA: Insufficient documentation

## 2015-06-19 DIAGNOSIS — D649 Anemia, unspecified: Secondary | ICD-10-CM | POA: Insufficient documentation

## 2015-06-19 DIAGNOSIS — Z79899 Other long term (current) drug therapy: Secondary | ICD-10-CM | POA: Diagnosis not present

## 2015-06-19 DIAGNOSIS — E1140 Type 2 diabetes mellitus with diabetic neuropathy, unspecified: Secondary | ICD-10-CM | POA: Diagnosis not present

## 2015-06-19 LAB — COMPREHENSIVE METABOLIC PANEL
ALBUMIN: 3.4 g/dL — AB (ref 3.5–5.0)
ALK PHOS: 83 U/L (ref 38–126)
ALT: 17 U/L (ref 17–63)
ANION GAP: 7 (ref 5–15)
AST: 38 U/L (ref 15–41)
BUN: 21 mg/dL — ABNORMAL HIGH (ref 6–20)
CALCIUM: 8.5 mg/dL — AB (ref 8.9–10.3)
CHLORIDE: 99 mmol/L — AB (ref 101–111)
CO2: 24 mmol/L (ref 22–32)
Creatinine, Ser: 1.21 mg/dL (ref 0.61–1.24)
GFR calc Af Amer: >60 mL/min (ref >60)
GFR calc non Af Amer: 58 mL/min — ABNORMAL LOW (ref >60)
GLUCOSE: 167 mg/dL — AB (ref 65–99)
POTASSIUM: 4.2 mmol/L (ref 3.5–5.1)
SODIUM: 130 mmol/L — AB (ref 135–145)
Total Bilirubin: 0.9 mg/dL (ref 0.3–1.2)
Total Protein: 6.9 g/dL (ref 6.5–8.1)

## 2015-06-19 LAB — CBC WITH DIFFERENTIAL/PLATELET
BASOS PCT: 0 %
Basophils Absolute: 0 10*3/uL (ref 0–0.1)
EOS ABS: 0 10*3/uL (ref 0–0.7)
EOS PCT: 1 %
HCT: 25.8 % — ABNORMAL LOW (ref 40.0–52.0)
HEMOGLOBIN: 8.5 g/dL — AB (ref 13.0–18.0)
LYMPHS PCT: 5 %
Lymphs Abs: 0.3 10*3/uL — ABNORMAL LOW (ref 1.0–3.6)
MCH: 26.1 pg (ref 26.0–34.0)
MCHC: 32.9 g/dL (ref 32.0–36.0)
MCV: 79.4 fL — ABNORMAL LOW (ref 80.0–100.0)
MONO ABS: 0.7 10*3/uL (ref 0.2–1.0)
MONOS PCT: 10 %
NEUTROS ABS: 5.8 10*3/uL (ref 1.4–6.5)
Neutrophils Relative %: 84 %
Platelets: 251 10*3/uL (ref 150–440)
RBC: 3.25 MIL/uL — ABNORMAL LOW (ref 4.40–5.90)
RDW: 16.7 % — ABNORMAL HIGH (ref 11.5–14.5)
WBC: 6.9 10*3/uL (ref 3.8–10.6)

## 2015-06-19 MED ORDER — SODIUM CHLORIDE 0.9 % IV BOLUS (SEPSIS): 1000.0000 mL | Freq: Once | INTRAVENOUS | Status: DC

## 2015-06-19 NOTE — ED Notes (Signed)
MD at bedside. 

## 2015-06-19 NOTE — ED Notes (Signed)
Pt had left knee replacement Dec 5th, has bone cancer, here today with extreme weakness, pt pale, denies cp.

## 2015-06-19 NOTE — ED Notes (Signed)
Pt verbalized understanding of discharge instructions. NAD at this time. 

## 2015-06-19 NOTE — Discharge Instructions (Signed)
Her hemoglobin level today is a 0.5, same as it was his past Wednesday. Continue your current treatment. Follow-up with your primary physician and with the doctors at the cancer center. Return to the emergency department if you have any further urgent concerns.  Anemia, Nonspecific Anemia is a condition in which the concentration of red blood cells or hemoglobin in the blood is below normal. Hemoglobin is a substance in red blood cells that carries oxygen to the tissues of the body. Anemia results in not enough oxygen reaching these tissues.  CAUSES  Common causes of anemia include:   Excessive bleeding. Bleeding may be internal or external. This includes excessive bleeding from periods (in women) or from the intestine.   Poor nutrition.   Chronic kidney, thyroid, and liver disease.  Bone marrow disorders that decrease red blood cell production.  Cancer and treatments for cancer.  HIV, AIDS, and their treatments.  Spleen problems that increase red blood cell destruction.  Blood disorders.  Excess destruction of red blood cells due to infection, medicines, and autoimmune disorders. SIGNS AND SYMPTOMS   Minor weakness.   Dizziness.   Headache.  Palpitations.   Shortness of breath, especially with exercise.   Paleness.  Cold sensitivity.  Indigestion.  Nausea.  Difficulty sleeping.  Difficulty concentrating. Symptoms may occur suddenly or they may develop slowly.  DIAGNOSIS  Additional blood tests are often needed. These help your health care provider determine the best treatment. Your health care provider will check your stool for blood and look for other causes of blood loss.  TREATMENT  Treatment varies depending on the cause of the anemia. Treatment can include:   Supplements of iron, vitamin 123456, or folic acid.   Hormone medicines.   A blood transfusion. This may be needed if blood loss is severe.   Hospitalization. This may be needed if there is  significant continual blood loss.   Dietary changes.  Spleen removal. HOME CARE INSTRUCTIONS Keep all follow-up appointments. It often takes many weeks to correct anemia, and having your health care provider check on your condition and your response to treatment is very important. SEEK IMMEDIATE MEDICAL CARE IF:   You develop extreme weakness, shortness of breath, or chest pain.   You become dizzy or have trouble concentrating.  You develop heavy vaginal bleeding.   You develop a rash.   You have bloody or black, tarry stools.   You faint.   You vomit up blood.   You vomit repeatedly.   You have abdominal pain.  You have a fever or persistent symptoms for more than 2-3 days.   You have a fever and your symptoms suddenly get worse.   You are dehydrated.  MAKE SURE YOU:  Understand these instructions.  Will watch your condition.  Will get help right away if you are not doing well or get worse.   This information is not intended to replace advice given to you by your health care provider. Make sure you discuss any questions you have with your health care provider.   Document Released: 07/20/2004 Document Revised: 02/12/2013 Document Reviewed: 12/06/2012 Elsevier Interactive Patient Education [employer].

## 2015-06-19 NOTE — ED Notes (Signed)
Pt seen by pcp ([facility]) on weds for this weakness and confusion . Was told yesterday to start taking iron pills for anemia.   Took a shower today and was extremely weak and told his wife "i don't think i can make it through today". Has stage 4 prostate/ bone ca. Not currently getting tx

## 2015-06-19 NOTE — ED Provider Notes (Signed)
[facility] Emergency Department Provider Note  ____________________________________________  Time seen: 2:30 PM  I have reviewed the triage vital signs and the nursing notes.  History by:  Patient and his wife.  HISTORY  Chief Complaint Weakness     HPI Derek Parker is a 74 y.o. male who has been diagnosed with prostate cancer with metastasis to the bones. He has been feeling increasingly weak over the past 1-2 weeks. He went to see his primary physician this past Wednesday. His hemoglobin was 8.5. This is been trending down over the past numerous months. His primary physician, Dr. Lovie Macadamia, started him on iron 2 days ago.  Today, the patient took a warm shower. When he finished the shower and was stepping out he felt actually weak. He turned to his wife and reported "I don't think, to make it through the day". The patient and his wife came to the emergency department. He is notably pale, but he is alert and communicative.  He reports he does have some tarry stools sometimes but there is no black this or bright red blood.  He denies any abdominal pain, nausea, vomiting. He does report general weight loss from 235-203 over the past few months after having started chemotherapy. He has no appetite.   Past Medical History  Diagnosis Date  . 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 Hypercholesteremia   . CAD (coronary artery disease)   . Neuropathy, diabetic ([place])   . Diabetes mellitus without complication ([place])   . HTN (hypertension)   . Myocardial infarction ([place])   . GERD (gastroesophageal reflux disease)   . Neuropathy [facility])     Patient Active Problem List   Diagnosis Date Noted  . 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 Coronary artery bypass graft    . Umbilical hernia repair    . Cholecystectomy    . Knee surgery Left   . Carotid stent    . Knee surgery Left 2006  . Hernia  repair    . Cataract extraction    . Knee arthroplasty Left 05/31/2015    Procedure: COMPUTER ASSISTED TOTAL KNEE ARTHROPLASTY;  Surgeon: [name], MD;  Location: [facility] ORS;  Service: Orthopedics;  Laterality: Left;    Current Outpatient Rx  Name  Route  Sig  Dispense  Refill  . alfuzosin (UROXATRAL) 10 MG 24 hr tablet   Oral   Take 1 tablet by mouth daily.         . Calcium Carb-Cholecalciferol (CALCIUM + D3) 600-200 MG-UNIT TABS   Oral   Take 1 tablet by mouth 2 (two) times daily before a meal.         . Cholecalciferol (VITAMIN D3) 2000 UNITS capsule   Oral   Take 5,000 Units by mouth daily.          . Coenzyme Q10 (CO Q 10 PO)   Oral   Take 1 capsule by mouth daily.         Marland Kitchen dexlansoprazole (DEXILANT) 60 MG capsule   Oral   Take 1 capsule by mouth daily.         Marland Kitchen enoxaparin (LOVENOX) 40 MG/0.4ML injection   Subcutaneous   Inject 0.4 mLs (40 mg total) into the skin daily.   14 Syringe   0   . Melatonin 5 MG CAPS      Start 1 cap nightly. Can increase  to 2 tabs nightly if needed after a week or so.         . metoprolol (LOPRESSOR) 50 MG tablet   Oral   Take 75 mg by mouth 2 (two) times daily.         . Multiple Vitamins-Minerals (MULTIVITAMIN ADULT PO)   Oral   Take 1 tablet by mouth daily.         . Naproxen Sodium (ALEVE PO)   Oral   Take 1 tablet by mouth 2 (two) times daily as needed (for moderate pain).          . niacin (NIASPAN) 500 MG CR tablet   Oral   Take 1,500 mg by mouth daily. 1 tablet every morning and 2 tablets at bedtime         . oxyCODONE (OXY IR/ROXICODONE) 5 MG immediate release tablet   Oral   Take 1-2 tablets (5-10 mg total) by mouth every 4 (four) hours as needed for severe pain or breakthrough pain.   80 tablet   0   . rosuvastatin (CRESTOR) 20 MG tablet   Oral   Take 20 mg by mouth daily.         . solifenacin (VESICARE) 5 MG tablet   Oral   Take 1 tablet by mouth daily.         . traMADol  (ULTRAM) 50 MG tablet   Oral   Take 1-2 tablets (50-100 mg total) by mouth every 4 (four) hours as needed for moderate pain.   60 tablet   1   . venlafaxine XR (EFFEXOR-XR) 75 MG 24 hr capsule   Oral   Take 3 capsules by mouth daily.           Allergies No known allergies  Family History  Problem Relation Age of Onset  . Cancer Father     prostate    Social History Social History  Substance Use Topics  . Smoking status: Former Smoker -- 40 years  . Smokeless tobacco: None  . Alcohol Use: 0.6 oz/week    1 Shots of liquor per week    Review of Systems  Constitutional: Negative for fever. Notable for general weakness. ENT: Negative for congestion. Cardiovascular: Negative for chest pain. No palpitations. Patient has felt weak. Respiratory: Negative for cough. Gastrointestinal: Negative for abdominal pain, vomiting and diarrhea. Genitourinary: Negative for dysuria. Musculoskeletal: No back pain. Skin: Pale. No rash. Neurological: Negative for headache or focal weakness   10-point ROS otherwise negative.  ____________________________________________   PHYSICAL EXAM:  VITAL SIGNS: ED Triage Vitals  Enc Vitals Group     BP 06/19/15 1332 126/46 mmHg     Pulse Rate 06/19/15 1332 87     Resp 06/19/15 1332 18     Temp 06/19/15 1332 97.8 F (36.6 C)     Temp Source 06/19/15 1332 Oral     SpO2 06/19/15 1332 98 %     Weight 06/19/15 1332 203 lb (92.08 kg)     Height 06/19/15 1332 6' (1.829 m)     Head Cir --      Peak Flow --      Pain Score --      Pain Loc --      Pain Edu? --      Excl. in [place]? --     Constitutional: Alert and oriented. Well appearing and in no distress. ENT   Head: Normocephalic and atraumatic.   Nose: No congestion/rhinnorhea.  Mouth: No erythema, no swelling   Cardiovascular: Normal rate, regular rhythm, no murmur noted Respiratory:  Normal respiratory effort, no tachypnea.    Breath sounds are clear and equal  bilaterally.  Gastrointestinal: Soft, no distention. Nontender Back: No muscle spasm, no tenderness, no CVA tenderness. Musculoskeletal: No deformity noted. Nontender with normal range of motion in all extremities.  No noted edema. Neurologic:  Communicative. Normal appearing spontaneous movement in all 4 extremities. No gross focal neurologic deficits are appreciated.  Skin:  Patient with some pallor. No rash noted. Psychiatric: Mood and affect are normal. Speech and behavior are normal.  ____________________________________________    LABS (pertinent positives/negatives)  Labs Reviewed  CBC WITH DIFFERENTIAL/PLATELET - Abnormal; Notable for the following:    RBC 3.25 (*)    Hemoglobin 8.5 (*)    HCT 25.8 (*)    MCV 79.4 (*)    RDW 16.7 (*)    Lymphs Abs 0.3 (*)    All other components within normal limits  COMPREHENSIVE METABOLIC PANEL - Abnormal; Notable for the following:    Sodium 130 (*)    Chloride 99 (*)    Glucose, Bld 167 (*)    BUN 21 (*)    Calcium 8.5 (*)    Albumin 3.4 (*)    GFR calc non Af Amer 58 (*)    All other components within normal limits  URINALYSIS COMPLETEWITH MICROSCOPIC ([facility] ONLY)     ____________________________________________   EKG  ED ECG REPORT I, Marvelous Woolford W, the attending physician, personally viewed and interpreted this ECG.   Date: 06/19/2015  EKG Time: 1421  Rate: 78  Rhythm:   sinus rhythm with left bundle-branch block   Axis: Normal  Intervals: Normal  ST&T Change: None noted   ____________________________________________   ____________________________________________   INITIAL IMPRESSION / ASSESSMENT AND PLAN / ED COURSE  Pertinent labs & imaging results that were available during my care of the patient were reviewed by me and considered in my medical decision making (see chart for details).  Pleasant, humerus, 74 year old male, in no acute distress, though he does appear somewhat pale and weak. He had acute  weakness getting out of a warm shower. This may have been from arteriovenous dilatation, bounding his anemia.  I am most concerned about worsening anemia, given his slow decline and hemoglobin through this past year and his recent low level of 8.5.  Rectal exam is negative with heme negative stool.  If his hemoglobin is low, we'll seek admission for transfusion. If it remains at his current baseline, we will discharge him for follow-up with his regular physicians.  ----------------------------------------- 3:08 PM on 06/19/2015 -----------------------------------------  Patient's hemoglobin is 8.5. His sodium is low at 1:30. Renal function is okay with a BUN of 21 and creatinine of 1.21.  While the patient appears a little bit weak overall, he is currently stable and may be discharged with outpatient follow-up. He knows why certainly prefer this given the Christmas holidays.   ____________________________________________   FINAL CLINICAL IMPRESSION(S) / ED DIAGNOSES  Final diagnoses:  Near syncope  Anemia, unspecified anemia type   metastatic prostate cancer  Ahmed Prima, MD 06/19/15 [phone]

## 2015-06-28 ENCOUNTER — Emergency Department
Admission: EM | Admit: 2015-06-28 | Discharge: 2015-06-28 | Disposition: A | Discharge status: Home / Self Care | Payer: Medicare Other | Attending: Emergency Medicine | Admitting: Emergency Medicine

## 2015-06-28 ENCOUNTER — Emergency Department: Payer: Medicare Other

## 2015-06-28 ENCOUNTER — Encounter: Payer: Self-pay | Admitting: Emergency Medicine

## 2015-06-28 DIAGNOSIS — E1140 Type 2 diabetes mellitus with diabetic neuropathy, unspecified: Secondary | ICD-10-CM | POA: Diagnosis not present

## 2015-06-28 DIAGNOSIS — Z79899 Other long term (current) drug therapy: Secondary | ICD-10-CM | POA: Insufficient documentation

## 2015-06-28 DIAGNOSIS — R4182 Altered mental status, unspecified: Secondary | ICD-10-CM | POA: Insufficient documentation

## 2015-06-28 DIAGNOSIS — I10 Essential (primary) hypertension: Secondary | ICD-10-CM | POA: Insufficient documentation

## 2015-06-28 DIAGNOSIS — Z87891 Personal history of nicotine dependence: Secondary | ICD-10-CM | POA: Diagnosis not present

## 2015-06-28 DIAGNOSIS — R531 Weakness: Secondary | ICD-10-CM

## 2015-06-28 LAB — COMPREHENSIVE METABOLIC PANEL
ALK PHOS: 100 U/L (ref 38–126)
ALT: 26 U/L (ref 17–63)
ANION GAP: 7 (ref 5–15)
AST: 53 U/L — ABNORMAL HIGH (ref 15–41)
Albumin: 3.3 g/dL — ABNORMAL LOW (ref 3.5–5.0)
BILIRUBIN TOTAL: 0.5 mg/dL (ref 0.3–1.2)
BUN: 27 mg/dL — ABNORMAL HIGH (ref 6–20)
CALCIUM: 9.2 mg/dL (ref 8.9–10.3)
CO2: 24 mmol/L (ref 22–32)
Chloride: 100 mmol/L — ABNORMAL LOW (ref 101–111)
Creatinine, Ser: 1.05 mg/dL (ref 0.61–1.24)
GFR calc non Af Amer: >60 mL/min (ref >60)
GLUCOSE: 125 mg/dL — AB (ref 65–99)
Potassium: 5 mmol/L (ref 3.5–5.1)
SODIUM: 131 mmol/L — AB (ref 135–145)
TOTAL PROTEIN: 7.1 g/dL (ref 6.5–8.1)

## 2015-06-28 LAB — CBC
HCT: 28.1 % — ABNORMAL LOW (ref 40.0–52.0)
HEMOGLOBIN: 9.2 g/dL — AB (ref 13.0–18.0)
MCH: 25.6 pg — ABNORMAL LOW (ref 26.0–34.0)
MCHC: 32.9 g/dL (ref 32.0–36.0)
MCV: 77.6 fL — ABNORMAL LOW (ref 80.0–100.0)
Platelets: 342 10*3/uL (ref 150–440)
RBC: 3.62 MIL/uL — ABNORMAL LOW (ref 4.40–5.90)
RDW: 17.7 % — ABNORMAL HIGH (ref 11.5–14.5)
WBC: 6.2 10*3/uL (ref 3.8–10.6)

## 2015-06-28 LAB — MAGNESIUM: Magnesium: 2.1 mg/dL (ref 1.7–2.4)

## 2015-06-28 LAB — TROPONIN I

## 2015-06-28 LAB — TSH: TSH: 1.465 u[IU]/mL (ref 0.350–4.500)

## 2015-06-28 LAB — GLUCOSE, CAPILLARY: GLUCOSE-CAPILLARY: 113 mg/dL — AB (ref 65–99)

## 2015-06-28 MED ORDER — SODIUM CHLORIDE 0.9 % IV BOLUS (SEPSIS)
500.0000 mL | Freq: Once | INTRAVENOUS | Status: AC
  Administered 2015-06-28: 500 mL via INTRAVENOUS

## 2015-06-28 NOTE — Discharge Instructions (Signed)
Weakness Weakness is a lack of strength. It may be felt all over the body (generalized) or in one specific part of the body (focal). Some causes of weakness can be serious. You may need further medical evaluation, especially if you are elderly or you have a history of immunosuppression (such as chemotherapy or HIV), kidney disease, heart disease, or diabetes. CAUSES  Weakness can be caused by many different things, including:  Infection.  Physical exhaustion.  Internal bleeding or other blood loss that results in a lack of red blood cells (anemia).  Dehydration. This cause is more common in elderly people.  Side effects or electrolyte abnormalities from medicines, such as pain medicines or sedatives.  Emotional distress, anxiety, or depression.  Circulation problems, especially severe peripheral arterial disease.  Heart disease, such as rapid atrial fibrillation, bradycardia, or heart failure.  Nervous system disorders, such as Guillain-Barr syndrome, multiple sclerosis, or stroke. DIAGNOSIS  To find the cause of your weakness, your caregiver will take your history and perform a physical exam. Lab tests or X-rays may also be ordered, if needed. TREATMENT  Treatment of weakness depends on the cause of your symptoms and can vary greatly. HOME CARE INSTRUCTIONS   Rest as needed.  Eat a well-balanced diet.  Try to get some exercise every day.  Only take over-the-counter or prescription medicines as directed by your caregiver. SEEK MEDICAL CARE IF:   Your weakness seems to be getting worse or spreads to other parts of your body.  You develop new aches or pains. SEEK IMMEDIATE MEDICAL CARE IF:   You cannot perform your normal daily activities, such as getting dressed and feeding yourself.  You cannot walk up and down stairs, or you feel exhausted when you do so.  You have shortness of breath or chest pain.  You have difficulty moving parts of your body.  You have weakness  in only one area of the body or on only one side of the body.  You have a fever.  You have trouble speaking or swallowing.  You cannot control your bladder or bowel movements.  You have black or bloody vomit or stools. MAKE SURE YOU:  Understand these instructions.  Will watch your condition.  Will get help right away if you are not doing well or get worse.   This information is not intended to replace advice given to you by your health care provider. Make sure you discuss any questions you have with your health care provider.   Document Released: 06/12/2005 Document Revised: 12/12/2011 Document Reviewed: 08/11/2011 Elsevier Interactive Patient Education [employer].  Please return immediately if condition worsens. Please contact her primary physician or the physician you were given for referral. If you have any specialist physicians involved in her treatment and plan please also contact them. Thank you for using  [facility].

## 2015-06-28 NOTE — ED Provider Notes (Signed)
Time Seen: Approximately 1500  I have reviewed the triage notes  Chief Complaint: Altered Mental Status and Weakness   History of Present Illness: Derek Parker is a 75 y.o. male who is been seen and evaluated here recently for generalized weakness. Patient states he still feels generalized weakness but the new concerns was some episodes of confusion at home. Unsure whether or not he was dreaming when he first woke woke up or he was having episodes of confusion according to the wife. He was seeing things and people that weren't there and it seemed to be fairly transient but happened a couple of times this morning. Patient denies any headaches or new focal neurologic deficits. He denies any visual disturbances, trouble with speech or swallowing. He denies any melena or hematochezia. He denies any back or flank pain.   Past Medical History  Diagnosis Date  . 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 Hypercholesteremia   . CAD (coronary artery disease)   . Neuropathy, diabetic ([place])   . Diabetes mellitus without complication ([place])   . HTN (hypertension)   . Myocardial infarction ([place])   . GERD (gastroesophageal reflux disease)   . Neuropathy [facility])     Patient Active Problem List   Diagnosis Date Noted  . 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 Coronary artery bypass graft    . Umbilical hernia repair    . Cholecystectomy    . Knee surgery Left   . Carotid stent    . Knee surgery Left 2006  . Hernia repair    . Cataract extraction    . Knee arthroplasty Left 05/31/2015    Procedure: COMPUTER ASSISTED TOTAL KNEE ARTHROPLASTY;  Surgeon: [name], MD;  Location: [facility] ORS;  Service: Orthopedics;  Laterality: Left;    Past Surgical History  Procedure Laterality Date  . Coronary artery bypass graft    . Umbilical hernia repair    . Cholecystectomy    . Knee surgery Left   . Carotid stent    . Knee surgery  Left 2006  . Hernia repair    . Cataract extraction    . Knee arthroplasty Left 05/31/2015    Procedure: COMPUTER ASSISTED TOTAL KNEE ARTHROPLASTY;  Surgeon: [name], MD;  Location: [facility] ORS;  Service: Orthopedics;  Laterality: Left;    Current Outpatient Rx  Name  Route  Sig  Dispense  Refill  . alfuzosin (UROXATRAL) 10 MG 24 hr tablet   Oral   Take 1 tablet by mouth daily.         . Calcium Carb-Cholecalciferol (CALCIUM + D3) 600-200 MG-UNIT TABS   Oral   Take 1 tablet by mouth 2 (two) times daily before a meal.         . clopidogrel (PLAVIX) 75 MG tablet   Oral   Take 75 mg by mouth daily.         . Coenzyme Q10 (CO Q 10 PO)   Oral   Take 1 capsule by mouth daily.         Marland Kitchen dexlansoprazole (DEXILANT) 60 MG capsule   Oral   Take 1 capsule by mouth daily.         . ferrous fumarate (HEMOCYTE - 106 MG FE) 325 (106 Fe) MG TABS tablet   Oral   Take 1 tablet by mouth 2 (two) times daily.         Marland Kitchen  HYDROcodone-acetaminophen (NORCO/VICODIN) 5-325 MG tablet   Oral   Take 1-2 tablets by mouth every 4 (four) hours as needed.      0   . metoprolol (LOPRESSOR) 50 MG tablet   Oral   Take 75 mg by mouth 2 (two) times daily.         . niacin (NIASPAN) 500 MG CR tablet   Oral   Take 1,500 mg by mouth daily. 1 tablet every morning and 2 tablets at bedtime         . oxyCODONE (OXY IR/ROXICODONE) 5 MG immediate release tablet   Oral   Take 1-2 tablets (5-10 mg total) by mouth every 4 (four) hours as needed for severe pain or breakthrough pain.   80 tablet   0   . rosuvastatin (CRESTOR) 20 MG tablet   Oral   Take 20 mg by mouth daily.         . solifenacin (VESICARE) 5 MG tablet   Oral   Take 1 tablet by mouth daily.         . traMADol (ULTRAM) 50 MG tablet   Oral   Take 1-2 tablets (50-100 mg total) by mouth every 4 (four) hours as needed for moderate pain.   60 tablet   1   . venlafaxine XR (EFFEXOR-XR) 75 MG 24 hr capsule   Oral   Take 3  capsules by mouth daily.           Allergies:  No known allergies  Family History: Family History  Problem Relation Age of Onset  . Cancer Father     prostate    Social History: Social History  Substance Use Topics  . Smoking status: Former Smoker -- 40 years  . Smokeless tobacco: None  . Alcohol Use: 0.6 oz/week    1 Shots of liquor per week     Review of Systems:   10 point review of systems was performed and was otherwise negative:  Constitutional: No fever Eyes: No visual disturbances ENT: No sore throat, ear pain Cardiac: No chest pain Respiratory: No shortness of breath, wheezing, or stridor Abdomen: No abdominal pain, no vomiting, No diarrhea Endocrine: No weight loss, No night sweats Extremities: No peripheral edema, cyanosis Skin: No rashes, easy bruising Neurologic: No focal weakness, trouble with speech or swollowing Urologic: No dysuria, Hematuria, or urinary frequency   Physical Exam:  ED Triage Vitals  Enc Vitals Group     BP 06/28/15 1406 123/73 mmHg     Pulse Rate 06/28/15 1406 84     Resp 06/28/15 1406 18     Temp 06/28/15 1406 97.5 F (36.4 C)     Temp Source 06/28/15 1406 Oral     SpO2 06/28/15 1406 97 %     Weight 06/28/15 1406 200 lb (90.719 kg)     Height 06/28/15 1406 6' (1.829 m)     Head Cir --      Peak Flow --      Pain Score 06/28/15 1407 0     Pain Loc --      Pain Edu? --      Excl. in [place]? --     General: Awake , Alert , and Oriented times 3; GCS 15 somewhat pale in appearance Head: Normal cephalic , atraumatic Eyes: Pupils equal , round, reactive to light Nose/Throat: No nasal drainage, patent upper airway without erythema or exudate.  Neck: Supple, Full range of motion, No anterior adenopathy or palpable thyroid masses Lungs:  Clear to ascultation without wheezes , rhonchi, or rales Heart: Regular rate, regular rhythm without murmurs , gallops , or rubs Abdomen: Soft, non tender without rebound, guarding , or  rigidity; bowel sounds positive and symmetric in all 4 quadrants. No organomegaly .        Extremities: 2 plus symmetric pulses. No edema, clubbing or cyanosis Neurologic: normal ambulation, Motor symmetric without deficits, sensory intact Skin: warm, dry, no rashes   Labs:   All laboratory work was reviewed including any pertinent negatives or positives listed below:  Labs Reviewed  COMPREHENSIVE METABOLIC PANEL - Abnormal; Notable for the following:    Sodium 131 (*)    Chloride 100 (*)    Glucose, Bld 125 (*)    BUN 27 (*)    Albumin 3.3 (*)    AST 53 (*)    All other components within normal limits  CBC - Abnormal; Notable for the following:    RBC 3.62 (*)    Hemoglobin 9.2 (*)    HCT 28.1 (*)    MCV 77.6 (*)    MCH 25.6 (*)    RDW 17.7 (*)    All other components within normal limits  GLUCOSE, CAPILLARY - Abnormal; Notable for the following:    Glucose-Capillary 113 (*)    All other components within normal limits  TROPONIN I  MAGNESIUM  TSH  CBG MONITORING, ED   review of his laboratory work showed no significant new findings  EKG: ED ECG REPORT I, Daymon Larsen, the attending physician, personally viewed and interpreted this ECG.  Date: 06/28/2015 EKG Time: 1414 Rate: 87 Rhythm: normal sinus rhythm QRS Axis: normal Intervals: Left bundle branch block ST/T Wave abnormalities: normal Conduction Disutrbances: none Narrative Interpretation: unremarkable No acute ischemic changes   Radiology: * EXAM: CT HEAD WITHOUT CONTRAST  TECHNIQUE: Contiguous axial images were obtained from the base of the skull through the vertex without intravenous contrast.  COMPARISON: Multiple exams, including 02/17/2014  FINDINGS: The brainstem, cerebellum, cerebral peduncles, thalami, basal ganglia, basilar cisterns, and ventricular system appear within normal limits. No intracranial hemorrhage, mass lesion, or acute CVA.  Mild chronic ethmoid sinusitis. There is  atherosclerotic calcification of the cavernous carotid arteries bilaterally. Mild chronic right frontal sinusitis. No observed calvarial metastatic lesions although there is some new faint sclerosis in the right mandibular condyle which was not present on 02/17/2014.  IMPRESSION: 1. Chronic ethmoid and right frontal sinusitis. No acute intracranial findings. 2. There is some mild sclerosis in the right mandibular condyle which was not present on 02/17/2014-I cannot exclude a sclerotic metastatic lesion in this vicinity although this might possibly be degenerative.    I personally reviewed the radiologic studies      ED Course: * Patient's stay here was uneventful and does not appear to have any focal neurologic deficits. Patient's otherwise hemodynamically stable and his laboratory work and objective studies all appeared within normal limits. He has a known history of anemia and I added some studies to make sure his thyroid, magnesium, troponin etc. all seemed to be within normal limits. He understands that further follow-up with his primary physician as necessary and return here if he has a fever or focal weakness. He was advised to discuss vitamin deficiencies etc. Patient's hemoglobin seems to be stable and has no history of melena or hematochezia.   Assessment: * Generalized weakness   Final Clinical Impression:  Generalized weakness Final diagnoses:  None     Plan: * Outpatient management that  did discuss that his workup for the weakness is not complete at this time. I'm not sure what to make of the morning hallucinations. It does not appear to be a psychiatric issue with depression and anxiety or acute psychosis.           Daymon Larsen, MD 06/28/15 [phone]

## 2015-06-28 NOTE — ED Notes (Signed)
Pt's wife reports pt with AMS for a few days now, reports increased weakness and shortness of breath with activity. Wife thinks maybe pt took too much hydrocodone earlier today, pt is alert and oriented, able to answer questions appropriately. Wife states "I just feel like something happened in his brain somewhere".

## 2015-07-01 ENCOUNTER — Other Ambulatory Visit: Payer: Self-pay | Admitting: Internal Medicine

## 2015-07-01 DIAGNOSIS — C61 Malignant neoplasm of prostate: Secondary | ICD-10-CM

## 2015-07-08 ENCOUNTER — Emergency Department: Payer: Medicare Other

## 2015-07-08 ENCOUNTER — Inpatient Hospital Stay
Admission: EM | Admit: 2015-07-08 | Discharge: 2015-07-14 | DRG: 871 | Disposition: A | Discharge status: Home / Self Care | Payer: Medicare Other | Attending: Internal Medicine | Admitting: Internal Medicine

## 2015-07-08 ENCOUNTER — Encounter: Payer: Self-pay | Admitting: *Deleted

## 2015-07-08 ENCOUNTER — Other Ambulatory Visit: Payer: Self-pay

## 2015-07-08 ENCOUNTER — Ambulatory Visit: Payer: Medicare Other

## 2015-07-08 ENCOUNTER — Ambulatory Visit
Admission: RE | Admit: 2015-07-08 | Discharge: 2015-07-08 | Disposition: A | Discharge status: Home / Self Care | Payer: Medicare Other | Source: Ambulatory Visit | Attending: Internal Medicine | Admitting: Internal Medicine

## 2015-07-08 DIAGNOSIS — F1010 Alcohol abuse, uncomplicated: Secondary | ICD-10-CM

## 2015-07-08 DIAGNOSIS — Z951 Presence of aortocoronary bypass graft: Secondary | ICD-10-CM

## 2015-07-08 DIAGNOSIS — J189 Pneumonia, unspecified organism: Secondary | ICD-10-CM | POA: Diagnosis not present

## 2015-07-08 DIAGNOSIS — E7800 Pure hypercholesterolemia, unspecified: Secondary | ICD-10-CM | POA: Diagnosis present

## 2015-07-08 DIAGNOSIS — E785 Hyperlipidemia, unspecified: Secondary | ICD-10-CM | POA: Diagnosis present

## 2015-07-08 DIAGNOSIS — C7951 Secondary malignant neoplasm of bone: Secondary | ICD-10-CM | POA: Diagnosis present

## 2015-07-08 DIAGNOSIS — Z7901 Long term (current) use of anticoagulants: Secondary | ICD-10-CM | POA: Diagnosis not present

## 2015-07-08 DIAGNOSIS — Z9049 Acquired absence of other specified parts of digestive tract: Secondary | ICD-10-CM

## 2015-07-08 DIAGNOSIS — C61 Malignant neoplasm of prostate: Secondary | ICD-10-CM | POA: Diagnosis not present

## 2015-07-08 DIAGNOSIS — E43 Unspecified severe protein-calorie malnutrition: Secondary | ICD-10-CM | POA: Insufficient documentation

## 2015-07-08 DIAGNOSIS — K219 Gastro-esophageal reflux disease without esophagitis: Secondary | ICD-10-CM | POA: Diagnosis present

## 2015-07-08 DIAGNOSIS — E876 Hypokalemia: Secondary | ICD-10-CM | POA: Diagnosis not present

## 2015-07-08 DIAGNOSIS — F40240 Claustrophobia: Secondary | ICD-10-CM | POA: Insufficient documentation

## 2015-07-08 DIAGNOSIS — I252 Old myocardial infarction: Secondary | ICD-10-CM | POA: Diagnosis not present

## 2015-07-08 DIAGNOSIS — E119 Type 2 diabetes mellitus without complications: Secondary | ICD-10-CM | POA: Diagnosis present

## 2015-07-08 DIAGNOSIS — A419 Sepsis, unspecified organism: Secondary | ICD-10-CM | POA: Diagnosis not present

## 2015-07-08 DIAGNOSIS — Z96652 Presence of left artificial knee joint: Secondary | ICD-10-CM | POA: Diagnosis present

## 2015-07-08 DIAGNOSIS — Z8042 Family history of malignant neoplasm of prostate: Secondary | ICD-10-CM

## 2015-07-08 DIAGNOSIS — Z8546 Personal history of malignant neoplasm of prostate: Secondary | ICD-10-CM

## 2015-07-08 DIAGNOSIS — G629 Polyneuropathy, unspecified: Secondary | ICD-10-CM | POA: Diagnosis present

## 2015-07-08 DIAGNOSIS — I447 Left bundle-branch block, unspecified: Secondary | ICD-10-CM | POA: Diagnosis present

## 2015-07-08 DIAGNOSIS — R000 Tachycardia, unspecified: Secondary | ICD-10-CM

## 2015-07-08 DIAGNOSIS — I10 Essential (primary) hypertension: Secondary | ICD-10-CM | POA: Diagnosis present

## 2015-07-08 DIAGNOSIS — Y95 Nosocomial condition: Secondary | ICD-10-CM | POA: Diagnosis present

## 2015-07-08 DIAGNOSIS — Z79818 Long term (current) use of other agents affecting estrogen receptors and estrogen levels: Secondary | ICD-10-CM | POA: Diagnosis not present

## 2015-07-08 DIAGNOSIS — R9721 Rising PSA following treatment for malignant neoplasm of prostate: Secondary | ICD-10-CM | POA: Insufficient documentation

## 2015-07-08 DIAGNOSIS — Z87891 Personal history of nicotine dependence: Secondary | ICD-10-CM

## 2015-07-08 DIAGNOSIS — Z5309 Procedure and treatment not carried out because of other contraindication: Secondary | ICD-10-CM | POA: Insufficient documentation

## 2015-07-08 DIAGNOSIS — E871 Hypo-osmolality and hyponatremia: Secondary | ICD-10-CM | POA: Diagnosis not present

## 2015-07-08 DIAGNOSIS — I2510 Atherosclerotic heart disease of native coronary artery without angina pectoris: Secondary | ICD-10-CM | POA: Diagnosis present

## 2015-07-08 DIAGNOSIS — Z79899 Other long term (current) drug therapy: Secondary | ICD-10-CM | POA: Diagnosis not present

## 2015-07-08 LAB — BASIC METABOLIC PANEL
Anion gap: 11 (ref 5–15)
BUN: 24 mg/dL — ABNORMAL HIGH (ref 6–20)
CHLORIDE: 104 mmol/L (ref 101–111)
CO2: 21 mmol/L — AB (ref 22–32)
Calcium: 9.2 mg/dL (ref 8.9–10.3)
Creatinine, Ser: 1.63 mg/dL — ABNORMAL HIGH (ref 0.61–1.24)
GFR calc non Af Amer: 40 mL/min — ABNORMAL LOW (ref >60)
GFR, EST AFRICAN AMERICAN: 47 mL/min — AB (ref >60)
Glucose, Bld: 268 mg/dL — ABNORMAL HIGH (ref 65–99)
POTASSIUM: 5 mmol/L (ref 3.5–5.1)
SODIUM: 136 mmol/L (ref 135–145)

## 2015-07-08 LAB — CREATININE, SERUM
CREATININE: 1.38 mg/dL — AB (ref 0.61–1.24)
GFR calc non Af Amer: 50 mL/min — ABNORMAL LOW (ref >60)
GFR, EST AFRICAN AMERICAN: 57 mL/min — AB (ref >60)

## 2015-07-08 LAB — CBC WITH DIFFERENTIAL/PLATELET
BASOS PCT: 1 %
Basophils Absolute: 0.1 10*3/uL (ref 0–0.1)
EOS ABS: 0 10*3/uL (ref 0–0.7)
Eosinophils Relative: 0 %
HEMATOCRIT: 39 % — AB (ref 40.0–52.0)
HEMOGLOBIN: 12.6 g/dL — AB (ref 13.0–18.0)
Lymphocytes Relative: 11 %
Lymphs Abs: 1.1 10*3/uL (ref 1.0–3.6)
MCH: 26.3 pg (ref 26.0–34.0)
MCHC: 32.4 g/dL (ref 32.0–36.0)
MCV: 81.2 fL (ref 80.0–100.0)
MONOS PCT: 9 %
Monocytes Absolute: 0.9 10*3/uL (ref 0.2–1.0)
Neutro Abs: 8.1 10*3/uL — ABNORMAL HIGH (ref 1.4–6.5)
Neutrophils Relative %: 79 %
Platelets: 257 10*3/uL (ref 150–440)
RBC: 4.8 MIL/uL (ref 4.40–5.90)
RDW: 18 % — ABNORMAL HIGH (ref 11.5–14.5)
WBC: 10.2 10*3/uL (ref 3.8–10.6)

## 2015-07-08 LAB — LACTIC ACID, PLASMA: LACTIC ACID, VENOUS: 1.8 mmol/L (ref 0.5–2.0)

## 2015-07-08 LAB — APTT: APTT: 32 s (ref 24–36)

## 2015-07-08 LAB — CBC
HCT: 30.3 % — ABNORMAL LOW (ref 40.0–52.0)
Hemoglobin: 10 g/dL — ABNORMAL LOW (ref 13.0–18.0)
MCH: 26.6 pg (ref 26.0–34.0)
MCHC: 33 g/dL (ref 32.0–36.0)
MCV: 80.6 fL (ref 80.0–100.0)
PLATELETS: 173 10*3/uL (ref 150–440)
RBC: 3.76 MIL/uL — AB (ref 4.40–5.90)
RDW: 17.8 % — ABNORMAL HIGH (ref 11.5–14.5)
WBC: 5.5 10*3/uL (ref 3.8–10.6)

## 2015-07-08 LAB — PROTIME-INR
INR: 1.15
PROTHROMBIN TIME: 14.9 s (ref 11.4–15.0)

## 2015-07-08 LAB — TROPONIN I: TROPONIN I: 0.09 ng/mL — AB (ref <0.031)

## 2015-07-08 MED ORDER — IPRATROPIUM-ALBUTEROL 0.5-2.5 (3) MG/3ML IN SOLN
3.0000 mL | RESPIRATORY_TRACT | Status: DC
  Administered 2015-07-08 – 2015-07-09 (×3): 3 mL via RESPIRATORY_TRACT
  Filled 2015-07-08 (×3): qty 3

## 2015-07-08 MED ORDER — CO Q 10 10 MG PO CAPS: ORAL_CAPSULE | Freq: Every day | ORAL | Status: DC

## 2015-07-08 MED ORDER — HYDROCODONE-ACETAMINOPHEN 5-325 MG PO TABS: 1.0000 | ORAL_TABLET | Freq: Four times a day (QID) | ORAL | Status: DC | PRN

## 2015-07-08 MED ORDER — SODIUM CHLORIDE 0.9 % IV SOLN
INTRAVENOUS | Status: DC
  Administered 2015-07-08 – 2015-07-09 (×2): via INTRAVENOUS

## 2015-07-08 MED ORDER — NIACIN ER 500 MG PO TBCR
1500.0000 mg | EXTENDED_RELEASE_TABLET | Freq: Every day | ORAL | Status: DC
  Administered 2015-07-08 – 2015-07-09 (×2): 1500 mg via ORAL
  Filled 2015-07-08 (×3): qty 3

## 2015-07-08 MED ORDER — DEXTROSE 5 % IV SOLN
2.0000 g | Freq: Once | INTRAVENOUS | Status: AC
  Administered 2015-07-08: 2 g via INTRAVENOUS
  Filled 2015-07-08: qty 2

## 2015-07-08 MED ORDER — SODIUM CHLORIDE 0.9 % IV BOLUS (SEPSIS)
1000.0000 mL | INTRAVENOUS | Status: AC
  Administered 2015-07-08: 1000 mL via INTRAVENOUS

## 2015-07-08 MED ORDER — CALCIUM CARBONATE-VITAMIN D 500-200 MG-UNIT PO TABS
1.0000 | ORAL_TABLET | Freq: Two times a day (BID) | ORAL | Status: DC
  Administered 2015-07-09 – 2015-07-14 (×11): 1 via ORAL
  Filled 2015-07-08 (×11): qty 1

## 2015-07-08 MED ORDER — PANTOPRAZOLE SODIUM 40 MG PO TBEC
40.0000 mg | DELAYED_RELEASE_TABLET | Freq: Every day | ORAL | Status: DC
  Administered 2015-07-08 – 2015-07-14 (×6): 40 mg via ORAL
  Filled 2015-07-08 (×7): qty 1

## 2015-07-08 MED ORDER — TRAMADOL HCL 50 MG PO TABS: 50.0000 mg | ORAL_TABLET | ORAL | Status: DC | PRN

## 2015-07-08 MED ORDER — SODIUM CHLORIDE 0.9 % IV BOLUS (SEPSIS)
1000.0000 mL | Freq: Once | INTRAVENOUS | Status: AC
  Administered 2015-07-08: 1000 mL via INTRAVENOUS

## 2015-07-08 MED ORDER — FERROUS FUMARATE 325 (106 FE) MG PO TABS
1.0000 | ORAL_TABLET | Freq: Two times a day (BID) | ORAL | Status: DC
  Administered 2015-07-08 – 2015-07-13 (×11): 106 mg via ORAL
  Filled 2015-07-08 (×17): qty 1

## 2015-07-08 MED ORDER — ALFUZOSIN HCL ER 10 MG PO TB24
10.0000 mg | ORAL_TABLET | Freq: Every day | ORAL | Status: DC
  Administered 2015-07-08 – 2015-07-10 (×3): 10 mg via ORAL
  Filled 2015-07-08 (×4): qty 1

## 2015-07-08 MED ORDER — ROSUVASTATIN CALCIUM 5 MG PO TABS
20.0000 mg | ORAL_TABLET | Freq: Every day | ORAL | Status: DC
  Administered 2015-07-08 – 2015-07-14 (×7): 20 mg via ORAL
  Filled 2015-07-08 (×2): qty 4
  Filled 2015-07-08 (×2): qty 1
  Filled 2015-07-08 (×3): qty 4

## 2015-07-08 MED ORDER — METOPROLOL TARTRATE 50 MG PO TABS
75.0000 mg | ORAL_TABLET | Freq: Two times a day (BID) | ORAL | Status: DC
  Administered 2015-07-08 – 2015-07-09 (×3): 75 mg via ORAL
  Filled 2015-07-08 (×3): qty 1

## 2015-07-08 MED ORDER — DEXTROSE 5 % IV SOLN
250.0000 mg | INTRAVENOUS | Status: DC
  Administered 2015-07-08 – 2015-07-10 (×3): 250 mg via INTRAVENOUS
  Filled 2015-07-08 (×5): qty 250

## 2015-07-08 MED ORDER — SODIUM CHLORIDE 0.9 % IJ SOLN
3.0000 mL | Freq: Two times a day (BID) | INTRAMUSCULAR | Status: DC
Start: 2015-07-08 — End: 2015-07-14
  Administered 2015-07-08 – 2015-07-13 (×10): 3 mL via INTRAVENOUS

## 2015-07-08 MED ORDER — DARIFENACIN HYDROBROMIDE ER 7.5 MG PO TB24
7.5000 mg | ORAL_TABLET | Freq: Every day | ORAL | Status: DC
  Administered 2015-07-08 – 2015-07-14 (×7): 7.5 mg via ORAL
  Filled 2015-07-08 (×8): qty 1

## 2015-07-08 MED ORDER — OXYCODONE HCL 5 MG PO TABS: 5.0000 mg | ORAL_TABLET | ORAL | Status: DC | PRN

## 2015-07-08 MED ORDER — VENLAFAXINE HCL ER 75 MG PO CP24
225.0000 mg | ORAL_CAPSULE | Freq: Every day | ORAL | Status: DC
  Administered 2015-07-08 – 2015-07-14 (×6): 225 mg via ORAL
  Filled 2015-07-08 (×2): qty 1
  Filled 2015-07-08 (×5): qty 3

## 2015-07-08 MED ORDER — HEPARIN SODIUM (PORCINE) 5000 UNIT/ML IJ SOLN
5000.0000 [IU] | Freq: Three times a day (TID) | INTRAMUSCULAR | Status: DC
  Administered 2015-07-08 – 2015-07-09 (×3): 5000 [IU] via SUBCUTANEOUS
  Filled 2015-07-08 (×3): qty 1

## 2015-07-08 MED ORDER — DEXTROSE 5 % IV SOLN
1.0000 g | INTRAVENOUS | Status: DC
  Administered 2015-07-08: 1 g via INTRAVENOUS
  Filled 2015-07-08 (×2): qty 10

## 2015-07-08 MED ORDER — CLOPIDOGREL BISULFATE 75 MG PO TABS
75.0000 mg | ORAL_TABLET | Freq: Every day | ORAL | Status: DC
  Administered 2015-07-08 – 2015-07-14 (×7): 75 mg via ORAL
  Filled 2015-07-08 (×7): qty 1

## 2015-07-08 MED ORDER — VANCOMYCIN HCL IN DEXTROSE 1-5 GM/200ML-% IV SOLN
1000.0000 mg | Freq: Once | INTRAVENOUS | Status: AC
  Administered 2015-07-08: 1000 mg via INTRAVENOUS
  Filled 2015-07-08: qty 200

## 2015-07-08 MED ORDER — DILTIAZEM HCL 25 MG/5ML IV SOLN
5.0000 mg | Freq: Once | INTRAVENOUS | Status: AC
  Administered 2015-07-08: 5 mg via INTRAVENOUS
  Filled 2015-07-08: qty 5

## 2015-07-08 MED ORDER — IOHEXOL 350 MG/ML SOLN
75.0000 mL | Freq: Once | INTRAVENOUS | Status: AC | PRN
  Administered 2015-07-08: 75 mL via INTRAVENOUS

## 2015-07-08 NOTE — H&P (Signed)
[facility] [place] NAME: Derek Parker    MR#:  [id number]  DATE OF BIRTH:  02/06/1941  DATE OF ADMISSION:  07/08/2015  PRIMARY CARE PHYSICIAN: Derek Pitch, MD   REQUESTING/REFERRING PHYSICIAN: [email or web address]  CHIEF COMPLAINT:   Chief Complaint  Patient presents with  . Shortness of Breath    HISTORY OF PRESENT ILLNESS: Derek Parker  is a 75 y.o. male with a known history of metastatic prostate cancer, coronary artery disease, hypertension, neuropathy- feeling short of breath for last 1 week that is with minimal exertion and associated with some cough but no sputum production or fever or chills. His oncology doctor scheduled him to get up PET scan but he got very anxious during that and could not do it. They had to speak to oncology doctor and he told about the shortness of breath complain. He also had knee replacement surgery last month, so oncology doctor got worried about him getting a blood clot and so sent him to emergency room right away to have workup for any blood clot. CT of the chest angiogram done in ER which did not show any embolism but it showed groundglass appearance and pneumonia. So given his admission to hospitalist team. He was also noted to have a heart rate running 130 to 140. He denies any palpitation, chest pain.  PAST MEDICAL HISTORY:   Past Medical History  Diagnosis Date  . Prostate cancer ([place])     metastatic to bone  . Hypercholesteremia   . CAD (coronary artery disease)   . HTN (hypertension)   . Myocardial infarction ([place])   . GERD (gastroesophageal reflux disease)   . Neuropathy ([place])     PAST SURGICAL HISTORY: Past Surgical History  Procedure Laterality Date  . Coronary artery bypass graft    . Umbilical hernia repair    . Cholecystectomy    . Knee surgery Left   . Carotid stent    . Knee surgery Left 2006  . Hernia repair    . Cataract extraction    . Knee arthroplasty Left 05/31/2015     Procedure: COMPUTER ASSISTED TOTAL KNEE ARTHROPLASTY;  Surgeon: Derek Leep, MD;  Location: [facility] ORS;  Service: Orthopedics;  Laterality: Left;    SOCIAL HISTORY:  Social History  Substance Use Topics  . Smoking status: Former Smoker -- 40 years  . Smokeless tobacco: Not on file  . Alcohol Use: 0.6 oz/week    1 Shots of liquor per week    FAMILY HISTORY:  Family History  Problem Relation Age of Onset  . Cancer Father     prostate    DRUG ALLERGIES:  Allergies  Allergen Reactions  . No Known Allergies     REVIEW OF SYSTEMS:   CONSTITUTIONAL: No fever, fatigue or weakness.  EYES: No blurred or double vision.  EARS, NOSE, AND THROAT: No tinnitus or ear pain.  RESPIRATORY: No cough, positive for shortness of breath, no wheezing or hemoptysis.  CARDIOVASCULAR: No chest pain, orthopnea, edema.  GASTROINTESTINAL: No nausea, vomiting, diarrhea or abdominal pain.  GENITOURINARY: No dysuria, hematuria.  ENDOCRINE: No polyuria, nocturia,  HEMATOLOGY: No anemia, easy bruising or bleeding SKIN: No rash or lesion. MUSCULOSKELETAL: No joint pain or arthritis.   NEUROLOGIC: No tingling, numbness, weakness.  PSYCHIATRY: No anxiety or depression.   MEDICATIONS AT HOME:  Prior to Admission medications   Medication Sig Start Date End Date Taking? Authorizing Provider  alfuzosin (UROXATRAL) 10 MG 24 hr  tablet Take 1 tablet by mouth daily.   Yes Historical Provider, MD  Calcium Carb-Cholecalciferol (CALCIUM + D3) 600-200 MG-UNIT TABS Take 1 tablet by mouth 2 (two) times daily before a meal.   Yes Historical Provider, MD  clopidogrel (PLAVIX) 75 MG tablet Take 75 mg by mouth daily.   Yes Historical Provider, MD  Coenzyme Q10 (CO Q 10 PO) Take 1 capsule by mouth daily.   Yes Historical Provider, MD  dexlansoprazole (DEXILANT) 60 MG capsule Take 1 capsule by mouth daily.   Yes Historical Provider, MD  ferrous fumarate (HEMOCYTE - 106 MG FE) 325 (106 Fe) MG TABS tablet Take 1 tablet by  mouth 2 (two) times daily.   Yes Historical Provider, MD  HYDROcodone-acetaminophen (NORCO/VICODIN) 5-325 MG tablet Take 1-2 tablets by mouth every 4 (four) hours as needed.   Yes Historical Provider, MD  metoprolol (LOPRESSOR) 50 MG tablet Take 75 mg by mouth 2 (two) times daily.   Yes Historical Provider, MD  niacin (NIASPAN) 500 MG CR tablet Take 1,500 mg by mouth daily. 1 tablet every morning and 2 tablets at bedtime   Yes Historical Provider, MD  oxyCODONE (OXY IR/ROXICODONE) 5 MG immediate release tablet Take 1-2 tablets (5-10 mg total) by mouth every 4 (four) hours as needed for severe pain or breakthrough pain. 06/01/15  Yes Watt Climes, PA  rosuvastatin (CRESTOR) 20 MG tablet Take 20 mg by mouth daily.   Yes Historical Provider, MD  solifenacin (VESICARE) 5 MG tablet Take 1 tablet by mouth daily.   Yes Historical Provider, MD  traMADol (ULTRAM) 50 MG tablet Take 1-2 tablets (50-100 mg total) by mouth every 4 (four) hours as needed for moderate pain. 06/01/15  Yes Watt Climes, PA  venlafaxine XR (EFFEXOR-XR) 75 MG 24 hr capsule Take 3 capsules by mouth daily.   Yes Historical Provider, MD      PHYSICAL EXAMINATION:   VITAL SIGNS: Blood pressure 135/87, pulse 132, temperature 98.2 F (36.8 C), temperature source Oral, resp. rate 20, height 6' (1.829 m), weight 87.998 kg (194 lb), SpO2 98 %.  GENERAL:  75 y.o.-year-old patient lying in the bed with no acute distress.  EYES: Pupils equal, round, reactive to light and accommodation. No scleral icterus. Extraocular muscles intact.  HEENT: Head atraumatic, normocephalic. Oropharynx and nasopharynx clear.  NECK:  Supple, no jugular venous distention. No thyroid enlargement, no tenderness.  LUNGS: Normal breath sounds bilaterally, no wheezing, some crepitation. No use of accessory muscles of respiration.  CARDIOVASCULAR: S1, S2 normal, tachycardia. No murmurs, rubs, or gallops.  ABDOMEN: Soft, nontender, nondistended. Bowel sounds present. No  organomegaly or mass.  EXTREMITIES: No pedal edema, cyanosis, or clubbing.  NEUROLOGIC: Cranial nerves II through XII are intact. Muscle strength 5/5 in all extremities. Sensation intact. Gait not checked.  PSYCHIATRIC: The patient is alert and oriented x 3.  SKIN: No obvious rash, lesion, or ulcer.   LABORATORY PANEL:   CBC  Recent Labs Lab 07/08/15 1652  WBC 10.2  HGB 12.6*  HCT 39.0*  PLT 257  MCV 81.2  MCH 26.3  MCHC 32.4  RDW 18.0*  LYMPHSABS 1.1  MONOABS 0.9  EOSABS 0.0  BASOSABS 0.1   ------------------------------------------------------------------------------------------------------------------  Chemistries   Recent Labs Lab 07/08/15 1652  NA 136  K 5.0  CL 104  CO2 21*  GLUCOSE 268*  BUN 24*  CREATININE 1.63*  CALCIUM 9.2   ------------------------------------------------------------------------------------------------------------------ estimated creatinine clearance is 45 mL/min (by C-G formula based on Cr of 1.63). ------------------------------------------------------------------------------------------------------------------  No results for input(s): TSH, T4TOTAL, T3FREE, THYROIDAB in the last 72 hours.  Invalid input(s): FREET3   Coagulation profile  Recent Labs Lab 07/08/15 1652  INR 1.15   ------------------------------------------------------------------------------------------------------------------- No results for input(s): DDIMER in the last 72 hours. -------------------------------------------------------------------------------------------------------------------  Cardiac Enzymes  Recent Labs Lab 07/08/15 1652  TROPONINI 0.09*   ------------------------------------------------------------------------------------------------------------------ Invalid input(s): POCBNP  ---------------------------------------------------------------------------------------------------------------  Urinalysis    Component Value Date/Time    COLORURINE YELLOW* 05/12/2015 0919   APPEARANCEUR CLEAR* 05/12/2015 0919   LABSPEC 1.021 05/12/2015 0919   PHURINE 5.0 05/12/2015 0919   GLUCOSEU NEGATIVE 05/12/2015 0919   HGBUR NEGATIVE 05/12/2015 0919   BILIRUBINUR NEGATIVE 05/12/2015 0919   KETONESUR NEGATIVE 05/12/2015 0919   PROTEINUR NEGATIVE 05/12/2015 0919   NITRITE NEGATIVE 05/12/2015 0919   LEUKOCYTESUR NEGATIVE 05/12/2015 0919     RADIOLOGY: Dg Chest 1 View  07/08/2015  CLINICAL DATA:  Shortness of breath, tachycardia EXAM: CHEST 1 VIEW COMPARISON:  10/30/2014 FINDINGS: Cardiomediastinal silhouette is stable. Status post CABG. No acute infiltrate or pleural effusion. No pulmonary edema. Chronic right AC joint separation is stable. IMPRESSION: No active disease. Electronically Signed   By: Lahoma Crocker M.D.   On: 07/08/2015 17:15   Ct Angio Chest Pe W/cm &/or Wo Cm  07/08/2015  CLINICAL DATA:  Stage IV prostate cancer. Shortness of breath and tachycardia. Recent knee surgery. EXAM: CT ANGIOGRAPHY CHEST WITH CONTRAST TECHNIQUE: Multidetector CT imaging of the chest was performed using the standard protocol during bolus administration of intravenous contrast. Multiplanar CT image reconstructions and MIPs were obtained to evaluate the vascular anatomy. CONTRAST:  74mL OMNIPAQUE IOHEXOL 350 MG/ML SOLN COMPARISON:  Chest radiograph from earlier today. NaF PET-CT from 07/29/2014. FINDINGS: Mediastinum/Nodes: The study is moderate quality for the evaluation of pulmonary embolism, limited by motion artifact, which limits evaluation of the segmental and subsegmental pulmonary artery branches, particularly in the left lung. There are no filling defects in the central, lobar, segmental or subsegmental pulmonary artery branches to suggest acute pulmonary embolism. Great vessels are normal in course and caliber. Normal heart size. No pericardial fluid/thickening. Left main, left anterior descending, left circumflex and right coronary  atherosclerosis, status post CABG with left internal mammary and ascending aortic coronary bypass grafts. Normal visualized thyroid. Normal esophagus. No pathologically enlarged axillary, mediastinal or hilar lymph nodes. Lungs/Pleura: No pneumothorax. No pleural effusion. Mild centrilobular emphysema. There are patchy regions of nodular consolidation and ground-glass opacity in the right middle lobe and basilar left lower lobe, which appear new, with the largest nodular foci measuring 9 mm in the medial right middle lobe (series 6/ image 95) and 7 mm in the left lower lobe (series 6/ image 109). Upper abdomen: Cholecystectomy. Musculoskeletal: Median sternotomy wires appear aligned and intact. Extensive patchy sclerotic osseous metastases throughout the entire thoracic skeleton appear stable to slightly increased since 07/29/2014, for example a new sclerotic T8 vertebral metastasis (series 6/image 80). Review of the MIP images confirms the above findings. IMPRESSION: 1. No evidence of acute pulmonary embolism, with mild limitations as described. 2. New patchy regions of nodular consolidation and ground-glass opacity in the right middle lobe and basilar left lower lobe, favor a nonspecific infectious or inflammatory pneumonitis/bronchopneumonia. Recommend follow-up chest CT in 3 months. 3. 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 4. Coronary atherosclerosis status post CABG. Mild centrilobular emphysema. Electronically Signed   By: Ilona Sorrel M.D.   On: 07/08/2015 18:02    EKG:  Orders placed or performed during the hospital encounter of 07/08/15  . ED EKG  . ED EKG    IMPRESSION AND PLAN:  * Community-acquired pneumonia  Rocephin and azithromycin.  Collect sputum and blood culture.  CT chest is done and reviewed.  * Metastatic prostate cancer  Regular follow-up with oncology as outpatient, was supposed  to have PET scan today.  Now due to presence of pneumonia I will call oncology consult in the hospital.  * Coronary artery disease  Continue aspirin and Plavix metoprolol and statin.  * Sinus tachycardia  This is likely secondary to pneumonia  Blood pressure is stable.  I will give injection Cardizem 1 time dose, is already on oral metoprolol at home.   All the records are reviewed and case discussed with ED provider. Management plans discussed with the patient, family and they are in agreement.  CODE STATUS: Code Status History    Date Active Date Inactive Code Status Order ID Comments User Context   05/31/2015  4:55 PM 06/02/2015  2:55 PM DNR [id number]  Haynes Dage, RN Inpatient   05/31/2015  1:02 PM 05/31/2015  4:55 PM Full Code [id number]  Derek Leep, MD Inpatient    Questions for Most Recent Historical Code Status (Order [id number])    Question Answer Comment   In the event of cardiac or respiratory ARREST Do not call a "code blue"    In the event of cardiac or respiratory ARREST Do not perform Intubation, CPR, defibrillation or ACLS    In the event of cardiac or respiratory ARREST Use medication by any route, position, wound care, and other measures to relive pain and suffering. May use oxygen, suction and manual treatment of airway obstruction as needed for comfort.     Advance Directive Documentation        Most Recent Value   Type of Advance Directive  Healthcare Power of Attorney, Living will   Pre-existing out of facility DNR order (yellow form or pink MOST form)     "MOST" Form in Place?         TOTAL TIME TAKING CARE OF THIS PATIENT: 50 minutes.    wife present in the room.  Vaughan Basta M.D on 07/08/2015   Between 7am to 6pm - Pager - [phone]  After 6pm go to [email or web address] - password [email or web address] [facility]  Office  [phone]  CC: Primary care physician; Derek Pitch, MD   Note: This dictation was prepared with  Dragon dictation along with smaller phrase technology. Any transcriptional errors that result from this process are unintentional.

## 2015-07-08 NOTE — ED Notes (Signed)
Patient transported to CT 

## 2015-07-08 NOTE — Progress Notes (Signed)

## 2015-07-08 NOTE — ED Provider Notes (Signed)
[facility] Emergency Department Provider Note  ____________________________________________  Time seen: Approximately [id number] PM  I have reviewed the triage vital signs and the nursing notes.   HISTORY  Chief Complaint Shortness of Breath    HPI Derek Parker is a 75 y.o. male with a history of stage IV prostate cancer who is presenting today with worsening shortness of breath over the past 6 weeks. He said that as long as he is still he has no symptoms but as soon as he gets up to walk around he becomes acutely short of breath. He denies any cough or fever. He denies any pain. Denies any leg swelling. Says that he has never had symptoms like this before. Has had previous blood transfusions for anemia. However, has never had any bleeding in his stool or in his urine. He says he has been checked multiple times for blood in the stool and each time has come back negative. Patient has a history of coronary artery disease with a bypass surgery and is currently taking Plavix.    Past Medical History  Diagnosis Date  . Prostate cancer ([place])     metastatic to bone  . Hypercholesteremia   . CAD (coronary artery disease)   . Neuropathy, diabetic ([place])   . Diabetes mellitus without complication ([place])   . HTN (hypertension)   . Myocardial infarction ([place])   . GERD (gastroesophageal reflux disease)   . Neuropathy [facility])     Patient Active Problem List   Diagnosis Date Noted  . 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 Coronary artery bypass graft    . Umbilical hernia repair    . Cholecystectomy    . Knee surgery Left   . Carotid stent    . Knee surgery Left 2006  . Hernia repair    . Cataract extraction    . Knee arthroplasty Left 05/31/2015    Procedure: COMPUTER ASSISTED TOTAL KNEE ARTHROPLASTY;  Surgeon: [name], MD;  Location: [facility] ORS;  Service: Orthopedics;  Laterality: Left;    Current  Outpatient Rx  Name  Route  Sig  Dispense  Refill  . alfuzosin (UROXATRAL) 10 MG 24 hr tablet   Oral   Take 1 tablet by mouth daily.         . Calcium Carb-Cholecalciferol (CALCIUM + D3) 600-200 MG-UNIT TABS   Oral   Take 1 tablet by mouth 2 (two) times daily before a meal.         . clopidogrel (PLAVIX) 75 MG tablet   Oral   Take 75 mg by mouth daily.         . Coenzyme Q10 (CO Q 10 PO)   Oral   Take 1 capsule by mouth daily.         Marland Kitchen dexlansoprazole (DEXILANT) 60 MG capsule   Oral   Take 1 capsule by mouth daily.         . ferrous fumarate (HEMOCYTE - 106 MG FE) 325 (106 Fe) MG TABS tablet   Oral   Take 1 tablet by mouth 2 (two) times daily.         Marland Kitchen HYDROcodone-acetaminophen (NORCO/VICODIN) 5-325 MG tablet   Oral   Take 1-2 tablets by mouth every 4 (four) hours as needed.      0   . metoprolol (LOPRESSOR) 50 MG tablet   Oral   Take 75 mg by mouth 2 (two) times  daily.         . niacin (NIASPAN) 500 MG CR tablet   Oral   Take 1,500 mg by mouth daily. 1 tablet every morning and 2 tablets at bedtime         . oxyCODONE (OXY IR/ROXICODONE) 5 MG immediate release tablet   Oral   Take 1-2 tablets (5-10 mg total) by mouth every 4 (four) hours as needed for severe pain or breakthrough pain.   80 tablet   0   . rosuvastatin (CRESTOR) 20 MG tablet   Oral   Take 20 mg by mouth daily.         . solifenacin (VESICARE) 5 MG tablet   Oral   Take 1 tablet by mouth daily.         . traMADol (ULTRAM) 50 MG tablet   Oral   Take 1-2 tablets (50-100 mg total) by mouth every 4 (four) hours as needed for moderate pain.   60 tablet   1   . venlafaxine XR (EFFEXOR-XR) 75 MG 24 hr capsule   Oral   Take 3 capsules by mouth daily.           Allergies No known allergies  Family History  Problem Relation Age of Onset  . Cancer Father     prostate    Social History Social History  Substance Use Topics  . Smoking status: Former Smoker -- 40  years  . Smokeless tobacco: None  . Alcohol Use: 0.6 oz/week    1 Shots of liquor per week    Review of Systems Constitutional: No fever/chills Eyes: No visual changes. ENT: No sore throat. Cardiovascular: Denies chest pain. Respiratory: As above Gastrointestinal: No abdominal pain.  No nausea, no vomiting.  No diarrhea.  No constipation. Genitourinary: Negative for dysuria. Musculoskeletal: Negative for back pain. Skin: Negative for rash. Neurological: Negative for headaches, focal weakness or numbness.  10-point ROS otherwise negative.  ____________________________________________   PHYSICAL EXAM:  VITAL SIGNS: ED Triage Vitals  Enc Vitals Group     BP 07/08/15 1645 133/76 mmHg     Pulse Rate 07/08/15 1645 147     Resp 07/08/15 1645 18     Temp 07/08/15 1645 98.2 F (36.8 C)     Temp Source 07/08/15 1645 Oral     SpO2 07/08/15 1645 98 %     Weight 07/08/15 1645 194 lb (87.998 kg)     Height 07/08/15 1645 6' (1.829 m)     Head Cir --      Peak Flow --      Pain Score 07/08/15 1646 0     Pain Loc --      Pain Edu? --      Excl. in [place]? --     Constitutional: Alert and oriented. Well appearing and in no acute distress. Eyes: Conjunctivae are normal. PERRL. EOMI. Head: Atraumatic. Nose: No congestion/rhinnorhea. Mouth/Throat: Mucous membranes are moist.  Oropharynx non-erythematous. Neck: No stridor.   Cardiovascular: Tachycardic, regular rhythm. Grossly normal heart sounds.  Good peripheral circulation. Respiratory: Normal respiratory effort.  No retractions. Lungs CTAB. Gastrointestinal: Soft and nontender. No distention. No abdominal bruits. No CVA tenderness. Musculoskeletal: No lower extremity tenderness nor edema.  No joint effusions. Left knee with healthy-appearing scar from knee replacement on December 5. No surrounding redness, tenderness or pus. Able to range the joint without difficulty. Neurologic:  Normal speech and language. No gross focal neurologic  deficits are appreciated. No gait instability. Skin:  Skin  is warm, dry and intact. No rash noted. Psychiatric: Mood and affect are normal. Speech and behavior are normal.  ____________________________________________   LABS (all labs ordered are listed, but only abnormal results are displayed)  Labs Reviewed  CBC WITH DIFFERENTIAL/PLATELET - Abnormal; Notable for the following:    Hemoglobin 12.6 (*)    HCT 39.0 (*)    RDW 18.0 (*)    Neutro Abs 8.1 (*)    All other components within normal limits  BASIC METABOLIC PANEL - Abnormal; Notable for the following:    CO2 21 (*)    Glucose, Bld 268 (*)    BUN 24 (*)    Creatinine, Ser 1.63 (*)    GFR calc non Af Amer 40 (*)    GFR calc Af Amer 47 (*)    All other components within normal limits  TROPONIN I - Abnormal; Notable for the following:    Troponin I 0.09 (*)    All other components within normal limits  PROTIME-INR  APTT   ____________________________________________  EKG  ED ECG REPORT I, Doran Stabler, the attending physician, personally viewed and interpreted this ECG.   Date: 07/08/2015  EKG Time: 1648  Rate: 143  Rhythm: sinus tachycardia  Axis: Normal axis  Intervals:left bundle branch block  ST&T Change: ST elevations consistent with a left bundle branch block. T-wave inversions in 1 and aVL. No evidence of STEMI.  ____________________________________________  RADIOLOGY  No acute findings on the chest x-ray. CAT scan of the chest without pulmonary embolus. New patchy regions of nodular consolidation ground glass opacity in the right middle lobe and basal left lower lobe which could be pneumonitis or bronchopneumonia. Patchy sclerotic metastasis throughout the thoracic skeleton. ____________________________________________   PROCEDURES   ____________________________________________   INITIAL IMPRESSION / ASSESSMENT AND PLAN / ED COURSE  Pertinent labs & imaging results that were available  during my care of the patient were reviewed by me and considered in my medical decision making (see chart for details).  ----------------------------------------- 6:31 PM on 07/08/2015 -----------------------------------------  Patient resting comfortably in the stretcher at this time. Heart rate still in the 130s after about a half liter of fluid. Updated the patient and his family about his lab as well as imaging results. Likely troponin elevation from his tachycardia. Favor pneumonia as diagnosis because of the CT read. We will write for hospital-acquired in secondary to patient having an admission December 5 for 3 days for knee surgery as well as being on Lupron and being immunocompromised. He understands the plan is one to comply. Signed out to Dr.[place].   ____________________________________________   FINAL CLINICAL IMPRESSION(S) / ED DIAGNOSES  Hospital-acquired pneumonia.    Orbie Pyo, MD 07/08/15 [phone]

## 2015-07-08 NOTE — ED Notes (Addendum)
Sent from oncoligist office for SOB and possible blood clot, states he had a knee replacement dec 5th, denies any chest pain, pt has prostate cancer stage 4, awake and alert, pt had blood transfusion last Wednesday, pt pale in color

## 2015-07-09 DIAGNOSIS — C61 Malignant neoplasm of prostate: Secondary | ICD-10-CM | POA: Insufficient documentation

## 2015-07-09 DIAGNOSIS — K219 Gastro-esophageal reflux disease without esophagitis: Secondary | ICD-10-CM

## 2015-07-09 DIAGNOSIS — Z79818 Long term (current) use of other agents affecting estrogen receptors and estrogen levels: Secondary | ICD-10-CM

## 2015-07-09 DIAGNOSIS — C7951 Secondary malignant neoplasm of bone: Secondary | ICD-10-CM

## 2015-07-09 DIAGNOSIS — Z951 Presence of aortocoronary bypass graft: Secondary | ICD-10-CM

## 2015-07-09 DIAGNOSIS — E43 Unspecified severe protein-calorie malnutrition: Secondary | ICD-10-CM | POA: Insufficient documentation

## 2015-07-09 DIAGNOSIS — Z7982 Long term (current) use of aspirin: Secondary | ICD-10-CM

## 2015-07-09 DIAGNOSIS — Z87891 Personal history of nicotine dependence: Secondary | ICD-10-CM

## 2015-07-09 DIAGNOSIS — J180 Bronchopneumonia, unspecified organism: Secondary | ICD-10-CM

## 2015-07-09 DIAGNOSIS — I2510 Atherosclerotic heart disease of native coronary artery without angina pectoris: Secondary | ICD-10-CM

## 2015-07-09 DIAGNOSIS — E7800 Pure hypercholesterolemia, unspecified: Secondary | ICD-10-CM

## 2015-07-09 DIAGNOSIS — Z79899 Other long term (current) drug therapy: Secondary | ICD-10-CM

## 2015-07-09 DIAGNOSIS — I252 Old myocardial infarction: Secondary | ICD-10-CM

## 2015-07-09 LAB — CBC
HCT: 28.2 % — ABNORMAL LOW (ref 40.0–52.0)
HEMOGLOBIN: 9.2 g/dL — AB (ref 13.0–18.0)
MCH: 26.4 pg (ref 26.0–34.0)
MCHC: 32.7 g/dL (ref 32.0–36.0)
MCV: 80.8 fL (ref 80.0–100.0)
Platelets: 150 10*3/uL (ref 150–440)
RBC: 3.49 MIL/uL — AB (ref 4.40–5.90)
RDW: 17.6 % — ABNORMAL HIGH (ref 11.5–14.5)
WBC: 5.6 10*3/uL (ref 3.8–10.6)

## 2015-07-09 LAB — LACTIC ACID, PLASMA: Lactic Acid, Venous: 1.8 mmol/L (ref 0.5–2.0)

## 2015-07-09 LAB — URINALYSIS COMPLETE WITH MICROSCOPIC (ARMC ONLY)
BILIRUBIN URINE: NEGATIVE
Bacteria, UA: NONE SEEN
GLUCOSE, UA: NEGATIVE mg/dL
Hgb urine dipstick: NEGATIVE
Ketones, ur: NEGATIVE mg/dL
Leukocytes, UA: NEGATIVE
NITRITE: NEGATIVE
Protein, ur: 30 mg/dL — AB
Specific Gravity, Urine: 1.042 — ABNORMAL HIGH (ref 1.005–1.030)
pH: 5 (ref 5.0–8.0)

## 2015-07-09 LAB — BASIC METABOLIC PANEL
ANION GAP: 5 (ref 5–15)
BUN: 23 mg/dL — ABNORMAL HIGH (ref 6–20)
CALCIUM: 7.8 mg/dL — AB (ref 8.9–10.3)
CHLORIDE: 105 mmol/L (ref 101–111)
CO2: 21 mmol/L — AB (ref 22–32)
CREATININE: 1.15 mg/dL (ref 0.61–1.24)
GFR calc non Af Amer: >60 mL/min (ref >60)
GLUCOSE: 132 mg/dL — AB (ref 65–99)
Potassium: 4.3 mmol/L (ref 3.5–5.1)
Sodium: 131 mmol/L — ABNORMAL LOW (ref 135–145)

## 2015-07-09 MED ORDER — IPRATROPIUM-ALBUTEROL 0.5-2.5 (3) MG/3ML IN SOLN: 3.0000 mL | RESPIRATORY_TRACT | Status: DC | PRN

## 2015-07-09 MED ORDER — ENOXAPARIN SODIUM 40 MG/0.4ML ~~LOC~~ SOLN
40.0000 mg | SUBCUTANEOUS | Status: DC
  Administered 2015-07-09 – 2015-07-13 (×5): 40 mg via SUBCUTANEOUS
  Filled 2015-07-09 (×5): qty 0.4

## 2015-07-09 MED ORDER — NIACIN ER 500 MG PO TBCR: 500.0000 mg | EXTENDED_RELEASE_TABLET | Freq: Every day | ORAL | Status: DC

## 2015-07-09 MED ORDER — DEXTROSE 5 % IV SOLN
1.0000 g | INTRAVENOUS | Status: DC
  Administered 2015-07-09 – 2015-07-12 (×4): 1 g via INTRAVENOUS
  Filled 2015-07-09 (×6): qty 10

## 2015-07-09 MED ORDER — ONDANSETRON HCL 4 MG/2ML IJ SOLN
4.0000 mg | INTRAMUSCULAR | Status: DC | PRN
  Administered 2015-07-09: 4 mg via INTRAVENOUS
  Filled 2015-07-09: qty 2

## 2015-07-09 MED ORDER — NIACIN ER 500 MG PO CPCR
500.0000 mg | ORAL_CAPSULE | Freq: Every day | ORAL | Status: DC
  Administered 2015-07-10 – 2015-07-14 (×5): 500 mg via ORAL
  Filled 2015-07-09 (×5): qty 1

## 2015-07-09 MED ORDER — NIACIN ER 500 MG PO CPCR
1000.0000 mg | ORAL_CAPSULE | Freq: Every day | ORAL | Status: DC
  Administered 2015-07-09 – 2015-07-13 (×5): 1000 mg via ORAL
  Filled 2015-07-09 (×5): qty 2

## 2015-07-09 MED ORDER — CALCIUM CARBONATE ANTACID 500 MG PO CHEW
1.0000 | CHEWABLE_TABLET | Freq: Four times a day (QID) | ORAL | Status: DC | PRN
  Administered 2015-07-09: 200 mg via ORAL
  Filled 2015-07-09: qty 1

## 2015-07-09 NOTE — Progress Notes (Signed)
MD, Hower aware of patient's HR sustaining in the 130's, no new orders received.

## 2015-07-09 NOTE — Progress Notes (Signed)
Received pt to floor at 1700 from 2nd  Floor. Alert. No resp distress. Uses  c pap at night.

## 2015-07-09 NOTE — Progress Notes (Signed)
Report called to Butch Penny Heslip on 1C, orderly will move patient to room 102, no telemetry

## 2015-07-09 NOTE — Evaluation (Signed)
Physical Therapy Evaluation Patient Details Name: Derek Parker MRN: [id number] DOB: 07/06/1940 Today's Date: 07/09/2015   History of Present Illness  Pt admitted for L TKR on 05/31/2015, he was discharged home and has recently been going to OP therapy at [facility]. He has been experiencing dizziness, increasing shortness of breath, and some confusion over the last month. 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 Negative scan for PEs.   Clinical Impression  Patient has recently been receiving outpatient PT services s/p L TKR on 06/01/2015. In this session he is independent with bed mobility and transfers, however he is unable to ambulate due to significant orthostasis. Multiple attempts at standing and exercises performed in standing provided, however BP dropped and maintained at low readings with each attempt at standing. Patient is likely appropriate for outpatient services to resume once he is able to maintain BP with transfers.     Follow Up Recommendations Outpatient PT - pending OOB assessment due to orthostatics     Equipment Recommendations       Recommendations for Other Services       Precautions / Restrictions Precautions Precautions: None Restrictions Weight Bearing Restrictions: Yes LLE Weight Bearing: Weight bearing as tolerated      Mobility  Bed Mobility Overal bed mobility: Independent             General bed mobility comments: No deficits noted, independent with bed mobility.   Transfers Overall transfer level: Independent               General transfer comment: No loss of balance noted, he demonstrates no speed deficit. Became symptomatic with orthostatic drop.   Ambulation/Gait             General Gait Details: Deferred due to orthostatics.   Stairs            Wheelchair Mobility    Modified Rankin (Stroke Patients Only)       Balance Overall balance assessment: No apparent balance  deficits (not formally assessed)                                           Pertinent Vitals/Pain Pain Assessment: No/denies pain    Home Living Family/patient expects to be discharged to:: Private residence Living Arrangements: Spouse/significant other Available Help at Discharge: Family Type of Home: House Home Access: Stairs to enter Entrance Stairs-Rails: Right Entrance Stairs-Number of Steps: 5 Home Layout: Able to live on main level with bedroom/bathroom Home Equipment: Gilford Rile - 2 wheels      Prior Function Level of Independence: Independent         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     Hand Dominance        Extremity/Trunk Assessment   Upper Extremity Assessment: Overall WFL for tasks assessed           Lower Extremity Assessment: Overall WFL for tasks assessed         Communication   Communication: No difficulties  Cognition Arousal/Alertness: Awake/alert Behavior During Therapy: WFL for tasks assessed/performed Overall Cognitive Status: Within Functional Limits for tasks assessed                      General Comments General comments (skin integrity, edema, etc.): No signs of infection from brief exam of L  knee.     Exercises        Assessment/Plan    PT Assessment Patient needs continued PT services  PT Diagnosis Difficulty walking   PT Problem List Decreased strength;Cardiopulmonary status limiting activity;Decreased mobility;Decreased balance  PT Treatment Interventions Therapeutic activities;Therapeutic exercise;Balance training;Stair training;Gait training;Patient/family education   PT Goals (Current goals can be found in the Care Plan section) Acute Rehab PT Goals Patient Stated Goal: To return home safely  PT Goal Formulation: With patient Time For Goal Achievement: 07/23/15 Potential to Achieve Goals: Good    Frequency Min 2X/week   Barriers to discharge        Co-evaluation                End of Session Equipment Utilized During Treatment: Gait belt Activity Tolerance: Treatment limited secondary to medical complications (Comment) (Orthostatics) Patient left: in bed;with call bell/phone within reach;with bed alarm set;with family/visitor present Nurse Communication: Mobility status (Orthostatics)         Time: [id number] PT Time Calculation (min) (ACUTE ONLY): 18 min   Charges:   PT Evaluation $PT Eval Moderate Complexity: 1 Procedure     PT G Codes:       Kerman Passey, PT, DPT    07/09/2015, 5:56 PM

## 2015-07-09 NOTE — Progress Notes (Signed)
Initial Nutrition Assessment  DOCUMENTATION CODES:   Severe malnutrition in context of acute illness/injury  INTERVENTION:   Meals and Snacks: Cater to patient preferences; offered scheduled snacks between meals, pt declined at this time Medical Food Supplement Therapy: offered variety of nutritional supplements, pt declined at this time. Pt wants to focus on meals at this time. Will reassess on follow-up   NUTRITION DIAGNOSIS:   Inadequate oral intake related to poor appetite as evidenced by per patient/family report.  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 Prostate cancer ([place])     metastatic to bone  . Hypercholesteremia   . CAD (coronary artery disease)   . HTN (hypertension)   . Myocardial infarction ([place])   . GERD (gastroesophageal reflux disease)   . Neuropathy ([place])     Diet Order:  Diet Heart Room service appropriate?: Yes; Fluid consistency:: Thin   Energy Intake: pt reports great appetite at present, eating 100% of meals  Food and nutrition related history: pt reports very poor appetite prior to admission for last 2-3 months, asked pt what he typically eats in a day and pt reports only bites/sips. Pt was taking some nutritional supplements that cancer center gave him but not drinking a lot   Skin:  Reviewed, no issues  Last BM:  1/12   Nutrition Focused Physical Exam: Nutrition-Focused physical exam completed. Findings are mild fat depletion, mild muscle depletion, and no edema.   Electrolyte and Renal Profile:  Recent Labs Lab 07/08/15 1652 07/08/15 2257 07/09/15 0443  BUN 24*  --  23*   CREATININE 1.63* 1.38* 1.15  NA 136  --  131*  K 5.0  --  4.3   Glucose Profile: No results for input(s): GLUCAP in the last 72 hours. Nutritional Anemia Profile:  CBC Latest Ref Rng 07/09/2015 07/08/2015 07/08/2015  WBC 3.8 - 10.6 K/uL 5.6 5.5 10.2  Hemoglobin 13.0 - 18.0 g/dL 9.2(L) 10.0(L) 12.6(L)  Hematocrit 40.0 - 52.0 % 28.2(L) 30.3(L) 39.0(L)  Platelets 150 - 440 K/uL 150 173 257    Meds: NS at 30 ml/hr  Height:   Ht Readings from Last 1 Encounters:  07/08/15 6' (1.829 m)    Weight: pt reports 35 pound wt loss in 2-3 months; 15.2% wt loss. 13% wt loss in 1-2 months per wt encounters  Wt Readings from Last 1 Encounters:  07/08/15 194 lb (87.998 kg)   Wt Readings from Last 10 Encounters:  07/08/15 194 lb (87.998 kg)  06/28/15 200 lb (90.719 kg)  06/19/15 203 lb (92.08 kg)  05/31/15 223 lb (101.152 kg)  05/19/15 223 lb 8.7 oz (101.4 kg)  05/12/15 220 lb (99.791 kg)  04/14/15 231 lb 7.7 oz (105 kg)  03/17/15 236 lb 15.9 oz (107.5 kg)  03/08/15 232 lb 14.7 oz (105.65 kg)  02/17/15 228 lb 13.4 oz (103.8 kg)    BMI:  Body mass index is 26.31 kg/(m^2).  Estimated Nutritional Needs:   Kcal:  [id number] kcals (BEE 1663, 1.3 AF, 1.1-1.3 IF)   Protein:  97-114 g (1.1-1.3 g/kg)   Fluid:  2200-2640 mL(25-30 ml/kg)     HIGH Care Level  Kerman Passey [place], [place], LDN [phone] Pager  [phone] Weekend/On-Call Pager

## 2015-07-09 NOTE — Consult Note (Signed)
Derek Parker CONSULT NOTE  Patient Care Team: Juluis Pitch, MD as PCP - General (Family Medicine)  CHIEF COMPLAINTS/PURPOSE OF CONSULTATION:  Prostate cancer  HISTORY OF PRESENTING ILLNESS:  Derek Parker 75 y.o.  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  Patient states that his not on any other active therapy;  And his  PSA has been in the range of 300s.  He was supposed to get a PET scan for follow-up;  When he noted to have sudden shortness of breath/ possible anxiety attack  While on the table for his PET scan.  Patient had a recent knee surgery-  Which led to the concerns for a PE.  Patient was then transferred to the hospital/ when he had a CTA-  That was negative for PE;  However showed tiny scattered opacities concerning for bronchopneumonia. Patient was admitted to the hospital;  Currently on IV antibiotics.  The bony  Windows showed slightly worse or stable bone disease.   Patient is  Doing better. Denies any shortness of breath or chest pain.  No nausea no vomiting.  Denies any bone pain at this time.  He is interested in having his PET scan done while in the hospital.   ROS: A complete 10 point review of system is done which is negative except mentioned above in history of present illness  MEDICAL HISTORY:  Past Medical History  Diagnosis Date  . Prostate cancer ([place])     metastatic to bone  . Hypercholesteremia   . CAD (coronary artery disease)   . HTN (hypertension)   . Myocardial infarction ([place])   . GERD (gastroesophageal reflux disease)   . Neuropathy ([place])     SURGICAL HISTORY: Past Surgical History  Procedure Laterality Date  . Coronary artery bypass graft    . Umbilical  hernia repair    . Cholecystectomy    . Knee surgery Left   . Carotid stent    . Knee surgery Left 2006  . Hernia repair    . Cataract extraction    . Knee arthroplasty Left 05/31/2015    Procedure: COMPUTER ASSISTED TOTAL KNEE ARTHROPLASTY;  Surgeon: [name], MD;  Location: [facility] ORS;  Service: Orthopedics;  Laterality: Left;    SOCIAL HISTORY: Social History   Social History  . Marital Status: Married    Spouse Name: N/A  . Number of Children: N/A  . Years of Education: N/A   Occupational History  . Not on file.   Social History Main Topics  . Smoking status: Former Smoker -- 40 years  . Smokeless tobacco: Not on file  . Alcohol Use: 0.6 oz/week    1 Shots of liquor per week  . Drug Use: No  . Sexual Activity: No   Other Topics Concern  . Not on file   Social History Narrative    FAMILY HISTORY: Family History  Problem Relation Age of Onset  . Cancer Father     prostate    ALLERGIES:  is allergic to no known allergies.  MEDICATIONS:  Current Facility-Administered Medications  Medication Dose Route Frequency Provider Last Rate Last Dose  . 0.9 %  sodium chloride infusion   Intravenous Continuous Richard  Wieting, MD 30 mL/hr at 07/09/15 1234    . alfuzosin (UROXATRAL) 24 hr tablet 10 mg  10 mg Oral Daily Vaughan Basta, MD   10 mg at 07/09/15 0946  . azithromycin (ZITHROMAX) 250 mg in dextrose 5 % 125 mL IVPB  250 mg Intravenous Q24H Vaughan Basta, MD   250 mg at 07/08/15 2312  . calcium carbonate (TUMS - dosed in mg elemental calcium) chewable tablet 200 mg of elemental calcium  1 tablet Oral Q6H PRN Lytle Butte, MD   200 mg of elemental calcium at 07/09/15 0234  . calcium-vitamin D (OSCAL WITH D) 500-200 MG-UNIT per tablet 1 tablet  1 tablet Oral BID AC Vaughan Basta, MD   1 tablet at 07/09/15 0945  . cefTRIAXone (ROCEPHIN) 1 g in dextrose 5 % 50 mL IVPB  1 g Intravenous Q24H Vaughan Basta, MD   1 g at 07/08/15 2358  .  clopidogrel (PLAVIX) tablet 75 mg  75 mg Oral Daily Vaughan Basta, MD   75 mg at 07/09/15 0944  . darifenacin (ENABLEX) 24 hr tablet 7.5 mg  7.5 mg Oral Daily Vaughan Basta, MD   7.5 mg at 07/09/15 0945  . ferrous fumarate (HEMOCYTE - 106 mg FE) tablet 106 mg of iron  1 tablet Oral BID Vaughan Basta, MD   106 mg of iron at 07/09/15 0945  . heparin injection 5,000 Units  5,000 Units Subcutaneous 3 times per day Vaughan Basta, MD   5,000 Units at 07/09/15 0557  . HYDROcodone-acetaminophen (NORCO/VICODIN) 5-325 MG per tablet 1-2 tablet  1-2 tablet Oral Q6H PRN Vaughan Basta, MD      . ipratropium-albuterol (DUONEB) 0.5-2.5 (3) MG/3ML nebulizer solution 3 mL  3 mL Nebulization Q4H Vaughan Basta, MD   3 mL at 07/09/15 0921  . metoprolol tartrate (LOPRESSOR) tablet 75 mg  75 mg Oral BID Vaughan Basta, MD   75 mg at 07/09/15 0945  . niacin (SLO-NIACIN) CR tablet 1,500 mg  1,500 mg Oral Daily Vaughan Basta, MD   1,500 mg at 07/09/15 0944  . ondansetron (ZOFRAN) injection 4 mg  4 mg Intravenous Q4H PRN Lytle Butte, MD   4 mg at 07/09/15 0234  . oxyCODONE (Oxy IR/ROXICODONE) immediate release tablet 5-10 mg  5-10 mg Oral Q4H PRN Vaughan Basta, MD      . pantoprazole (PROTONIX) EC tablet 40 mg  40 mg Oral QAC breakfast Vaughan Basta, MD   40 mg at 07/08/15 2322  . rosuvastatin (CRESTOR) tablet 20 mg  20 mg Oral Daily Vaughan Basta, MD   20 mg at 07/09/15 0945  . sodium chloride 0.9 % injection 3 mL  3 mL Intravenous Q12H Vaughan Basta, MD   3 mL at 07/09/15 0946  . traMADol (ULTRAM) tablet 50-100 mg  50-100 mg Oral Q4H PRN Vaughan Basta, MD      . venlafaxine XR (EFFEXOR-XR) 24 hr capsule 225 mg  225 mg Oral Daily Vaughan Basta, MD   225 mg at 07/08/15 2316      .  PHYSICAL EXAMINATION:   Filed Vitals:   07/09/15 0530 07/09/15 1146  BP: 121/66 106/48  Pulse: 78 82  Temp: 98 F (36.7 C)  97.9 F (36.6 C)  Resp: 18 18   Filed Weights   07/08/15 1645  Weight: 194 lb (87.998 kg)    GENERAL: Well-nourished well-developed; Alert, no distress and comfortable.   Accompanied by family. EYES: no pallor or icterus OROPHARYNX: no thrush or ulceration; NECK: supple, no  masses felt LYMPH:  no palpable lymphadenopathy in the cervical, axillary or inguinal regions LUNGS: clear to auscultation and  No wheeze or crackles HEART/CVS: regular rate & rhythm and no murmurs; No lower extremity edema ABDOMEN: abdomen soft, non-tender and normal bowel sounds Musculoskeletal:no cyanosis of digits and no clubbing  PSYCH: alert & oriented x 3 with fluent speech NEURO: no focal motor/sensory deficits SKIN:  no rashes or significant lesions;  Left knee-  Incision well healing. No erythema or discharge.  LABORATORY DATA:  I have reviewed the data as listed Lab Results  Component Value Date   WBC 5.6 07/09/2015   HGB 9.2* 07/09/2015   HCT 28.2* 07/09/2015   MCV 80.8 07/09/2015   PLT 150 07/09/2015    Recent Labs  06/19/15 1419 06/28/15 1420 07/08/15 1652 07/08/15 2257 07/09/15 0443  NA 130* 131* 136  --  131*  K 4.2 5.0 5.0  --  4.3  CL 99* 100* 104  --  105  CO2 24 24 21*  --  21*  GLUCOSE 167* 125* 268*  --  132*  BUN 21* 27* 24*  --  23*  CREATININE 1.21 1.05 1.63* 1.38* 1.15  CALCIUM 8.5* 9.2 9.2  --  7.8*  GFRNONAA 58* >60 40* 50* >60  GFRAA >60 >60 47* 57* >60  PROT 6.9 7.1  --   --   --   ALBUMIN 3.4* 3.3*  --   --   --   AST 38 53*  --   --   --   ALT 17 26  --   --   --   ALKPHOS 83 100  --   --   --   BILITOT 0.9 0.5  --   --   --     RADIOGRAPHIC STUDIES: I have personally reviewed the radiological images as listed and agreed with the findings in the report. Dg Chest 1 View  07/08/2015  CLINICAL DATA:  Shortness of breath, tachycardia EXAM: CHEST 1 VIEW COMPARISON:  10/30/2014 FINDINGS: Cardiomediastinal silhouette is stable. Status post CABG. No acute  infiltrate or pleural effusion. No pulmonary edema. Chronic right AC joint separation is stable. IMPRESSION: No active disease. Electronically Signed   By: Lahoma Crocker M.D.   On: 07/08/2015 17:15   Ct Head Wo Contrast  06/28/2015  CLINICAL DATA:  Altered mental status for a few days. Weakness and shortness of breath. Possible hydrocodone overdose. Prostate cancer with skeletal metastatic disease. EXAM: CT HEAD WITHOUT CONTRAST TECHNIQUE: Contiguous axial images were obtained from the base of the skull through the vertex without intravenous contrast. COMPARISON:  Multiple exams, including 02/17/2014 FINDINGS: The brainstem, cerebellum, cerebral peduncles, thalami, basal ganglia, basilar cisterns, and ventricular system appear within normal limits. No intracranial hemorrhage, mass lesion, or acute CVA. Mild chronic ethmoid sinusitis. There is atherosclerotic calcification of the cavernous carotid arteries bilaterally. Mild chronic right frontal sinusitis. No observed calvarial metastatic lesions although there is some new faint sclerosis in the right mandibular condyle which was not present on 02/17/2014. IMPRESSION: 1. Chronic ethmoid and right frontal sinusitis. No acute intracranial findings. 2. There is some mild sclerosis in the right mandibular condyle which was not present on 02/17/2014-I cannot exclude a sclerotic metastatic lesion in this vicinity although this might possibly be degenerative. Electronically Signed   By: Van Clines M.D.   On: 06/28/2015 16:14   Ct Angio Chest Pe W/cm &/or Wo Cm  07/08/2015  CLINICAL DATA:  Stage IV prostate cancer. Shortness of  breath and tachycardia. Recent knee surgery. EXAM: CT ANGIOGRAPHY CHEST WITH CONTRAST TECHNIQUE: Multidetector CT imaging of the chest was performed using the standard protocol during bolus administration of intravenous contrast. Multiplanar CT image reconstructions and MIPs were obtained to evaluate the vascular anatomy. CONTRAST:  67mL  OMNIPAQUE IOHEXOL 350 MG/ML SOLN COMPARISON:  Chest radiograph from earlier today. NaF PET-CT from 07/29/2014. FINDINGS: Mediastinum/Nodes: The study is moderate quality for the evaluation of pulmonary embolism, limited by motion artifact, which limits evaluation of the segmental and subsegmental pulmonary artery branches, particularly in the left lung. There are no filling defects in the central, lobar, segmental or subsegmental pulmonary artery branches to suggest acute pulmonary embolism. Great vessels are normal in course and caliber. Normal heart size. No pericardial fluid/thickening. Left main, left anterior descending, left circumflex and right coronary atherosclerosis, status post CABG with left internal mammary and ascending aortic coronary bypass grafts. Normal visualized thyroid. Normal esophagus. No pathologically enlarged axillary, mediastinal or hilar lymph nodes. Lungs/Pleura: No pneumothorax. No pleural effusion. Mild centrilobular emphysema. There are patchy regions of nodular consolidation and ground-glass opacity in the right middle lobe and basilar left lower lobe, which appear new, with the largest nodular foci measuring 9 mm in the medial right middle lobe (series 6/ image 95) and 7 mm in the left lower lobe (series 6/ image 109). Upper abdomen: Cholecystectomy. Musculoskeletal: Median sternotomy wires appear aligned and intact. Extensive patchy sclerotic osseous metastases throughout the entire thoracic skeleton appear stable to slightly increased since 07/29/2014, for example a new sclerotic T8 vertebral metastasis (series 6/image 80). Review of the MIP images confirms the above findings. IMPRESSION: 1. No evidence of acute pulmonary embolism, with mild limitations as described. 2. New patchy regions of nodular consolidation and ground-glass opacity in the right middle lobe and basilar left lower lobe, favor a nonspecific infectious or inflammatory pneumonitis/bronchopneumonia. Recommend  follow-up chest CT in 3 months. 3. 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 4. Coronary atherosclerosis status post CABG. Mild centrilobular emphysema. Electronically Signed   By: Ilona Sorrel M.D.   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  Currently on Lupron/ [facility]; [facility]].   As per the patient's request we will check if the PET scan to be done while the patient is  In the hospital.  Otherwise  This could be done as outpatient/ post discharge. As per family, pt has [facility].   #  Possible  pneumonia-  On antibiotics. Defer to primary team.   All questions were answered.  The above plan of care was discussed with the patient and his wife in detail.   Thank you Dr. Boyce Medici for allowing me to participate in the care of your pleasant patient. Please do not hesitate to contact me with questions or concerns in the interim.  I have paged Dr. Marthann Schiller  To discuss the above plan.      Cammie Sickle, MD 07/09/2015 1:02 PM

## 2015-07-09 NOTE — Progress Notes (Signed)
Per physical therapist Fraser Din patient was very hypotensive upon standing at bedside and did not recover his lying BP even after 4 minutes of standing.  Standing BP was 80/45.

## 2015-07-09 NOTE — Progress Notes (Signed)
Patient c/o nausea and reflux, PRN meds. Administered as ordered per Dr. Lavetta Nielsen.

## 2015-07-09 NOTE — Progress Notes (Signed)
Patient ID: MORT ESTOCK, male   DOB: [birth year], 75 y.o.   MRN: [id number] [facility] PROGRESS NOTE  EMONTE STRACKE [id number] DOB: 02-Jul-1940 DOA: 07/08/2015 PCP: Juluis Pitch, MD  HPI/Subjective: Patient came in with some disorientation and dizziness shortness of breath with ambulation. He feels better with regards to his breathing. Some cough. Still feels a little bit weak.  Objective: Filed Vitals:   07/09/15 0530 07/09/15 1146  BP: 121/66 106/48  Pulse: 78 82  Temp: 98 F (36.7 C) 97.9 F (36.6 C)  Resp: 18 18    Filed Weights   07/08/15 1645  Weight: 87.998 kg (194 lb)    ROS: Review of Systems  Constitutional: Negative for fever and chills.  Eyes: Negative for blurred vision.  Respiratory: Positive for cough and shortness of breath.   Cardiovascular: Negative for chest pain.  Gastrointestinal: Negative for nausea, vomiting, abdominal pain, diarrhea and constipation.  Genitourinary: Negative for dysuria.  Musculoskeletal: Negative for joint pain.  Neurological: Negative for dizziness and headaches.   Exam: Physical Exam  HENT:  Nose: No mucosal edema.  Mouth/Throat: No oropharyngeal exudate or posterior oropharyngeal edema.  Eyes: Conjunctivae, EOM and lids are normal. Pupils are equal, round, and reactive to light.  Neck: No JVD present. Carotid bruit is not present. No edema present. No thyroid mass and no thyromegaly present.  Cardiovascular: S1 normal and S2 normal.  Exam reveals no gallop.   No murmur heard. Pulses:      Dorsalis pedis pulses are 2+ on the right side, and 2+ on the left side.  Respiratory: No respiratory distress. He has no wheezes. He has no rhonchi. He has no rales.  GI: Soft. Bowel sounds are normal. There is no tenderness.  Musculoskeletal:       Right shoulder: He exhibits no swelling.  Lymphadenopathy:    He has no cervical adenopathy.  Neurological: He is alert. No cranial nerve deficit.  Skin: Skin is warm.  No rash noted. Nails show no clubbing.  Psychiatric: He has a normal mood and affect.    Data Reviewed: Basic Metabolic Panel:  Recent Labs Lab 07/08/15 1652 07/08/15 2257 07/09/15 0443  NA 136  --  131*  K 5.0  --  4.3  CL 104  --  105  CO2 21*  --  21*  GLUCOSE 268*  --  132*  BUN 24*  --  23*  CREATININE 1.63* 1.38* 1.15  CALCIUM 9.2  --  7.8*   CBC:  Recent Labs Lab 07/08/15 1652 07/08/15 2257 07/09/15 0443  WBC 10.2 5.5 5.6  NEUTROABS 8.1*  --   --   HGB 12.6* 10.0* 9.2*  HCT 39.0* 30.3* 28.2*  MCV 81.2 80.6 80.8  PLT 257 173 150   Cardiac Enzymes:  Recent Labs Lab 07/08/15 1652  TROPONINI 0.09*    Recent Results (from the past 240 hour(s))  Blood Culture (routine x 2)     Status: None (Preliminary result)   Collection Time: 07/08/15  7:11 PM  Result Value Ref Range Status   Specimen Description BLOOD LEFT ASSIST CONTROL  Final   Special Requests BOTTLES DRAWN AEROBIC AND ANAEROBIC 8CC  Final   Culture NO GROWTH < 12 HOURS  Final   Report Status PENDING  Incomplete  Blood Culture (routine x 2)     Status: None (Preliminary result)   Collection Time: 07/08/15  7:11 PM  Result Value Ref Range Status   Specimen Description BLOOD RIGHT ASSIST CONTROL  Final   Special Requests   Final    BOTTLES DRAWN AEROBIC AND ANAEROBIC [place] AERO 5CC ANA   Culture NO GROWTH < 12 HOURS  Final   Report Status PENDING  Incomplete     Studies: Dg Chest 1 View  07/08/2015  CLINICAL DATA:  Shortness of breath, tachycardia EXAM: CHEST 1 VIEW COMPARISON:  10/30/2014 FINDINGS: Cardiomediastinal silhouette is stable. Status post CABG. No acute infiltrate or pleural effusion. No pulmonary edema. Chronic right AC joint separation is stable. IMPRESSION: No active disease. Electronically Signed   By: Lahoma Crocker M.D.   On: 07/08/2015 17:15   Ct Angio Chest Pe W/cm &/or Wo Cm  07/08/2015  CLINICAL DATA:  Stage IV prostate cancer. Shortness of breath and tachycardia. Recent knee  surgery. EXAM: CT ANGIOGRAPHY CHEST WITH CONTRAST TECHNIQUE: Multidetector CT imaging of the chest was performed using the standard protocol during bolus administration of intravenous contrast. Multiplanar CT image reconstructions and MIPs were obtained to evaluate the vascular anatomy. CONTRAST:  5mL OMNIPAQUE IOHEXOL 350 MG/ML SOLN COMPARISON:  Chest radiograph from earlier today. NaF PET-CT from 07/29/2014. FINDINGS: Mediastinum/Nodes: The study is moderate quality for the evaluation of pulmonary embolism, limited by motion artifact, which limits evaluation of the segmental and subsegmental pulmonary artery branches, particularly in the left lung. There are no filling defects in the central, lobar, segmental or subsegmental pulmonary artery branches to suggest acute pulmonary embolism. Great vessels are normal in course and caliber. Normal heart size. No pericardial fluid/thickening. Left main, left anterior descending, left circumflex and right coronary atherosclerosis, status post CABG with left internal mammary and ascending aortic coronary bypass grafts. Normal visualized thyroid. Normal esophagus. No pathologically enlarged axillary, mediastinal or hilar lymph nodes. Lungs/Pleura: No pneumothorax. No pleural effusion. Mild centrilobular emphysema. There are patchy regions of nodular consolidation and ground-glass opacity in the right middle lobe and basilar left lower lobe, which appear new, with the largest nodular foci measuring 9 mm in the medial right middle lobe (series 6/ image 95) and 7 mm in the left lower lobe (series 6/ image 109). Upper abdomen: Cholecystectomy. Musculoskeletal: Median sternotomy wires appear aligned and intact. Extensive patchy sclerotic osseous metastases throughout the entire thoracic skeleton appear stable to slightly increased since 07/29/2014, for example a new sclerotic T8 vertebral metastasis (series 6/image 80). Review of the MIP images confirms the above findings.  IMPRESSION: 1. No evidence of acute pulmonary embolism, with mild limitations as described. 2. New patchy regions of nodular consolidation and ground-glass opacity in the right middle lobe and basilar left lower lobe, favor a nonspecific infectious or inflammatory pneumonitis/bronchopneumonia. Recommend follow-up chest CT in 3 months. 3. 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 4. Coronary atherosclerosis status post CABG. Mild centrilobular emphysema. Electronically Signed   By: Ilona Sorrel M.D.   On: 07/08/2015 18:02    Scheduled Meds: . alfuzosin  10 mg Oral Daily  . azithromycin  250 mg Intravenous Q24H  . calcium-vitamin D  1 tablet Oral BID AC  . cefTRIAXone (ROCEPHIN)  IV  1 g Intravenous Q24H  . clopidogrel  75 mg Oral Daily  . darifenacin  7.5 mg Oral Daily  . ferrous fumarate  1 tablet Oral BID  . heparin  5,000 Units Subcutaneous 3 times per day  . metoprolol  75 mg Oral BID  . niacin  1,500 mg Oral Daily  . pantoprazole  40 mg Oral QAC breakfast  . rosuvastatin  20 mg Oral Daily  . sodium chloride  3 mL Intravenous Q12H  . venlafaxine XR  225 mg Oral Daily   Continuous Infusions: . sodium chloride 30 mL/hr at 07/09/15 1234    Assessment/Plan:  1. Clinical sepsis, pneumonia seen on CT scan. Patient started on Rocephin and Zithromax. Clinically looks a lot better today. 2. Metastatic prostate cancer to bone- seen by oncology. Follow-up as outpatient 3. History of coronary artery disease on aspirin and Plavix metoprolol and statin 4. Sinus tachycardia- secondary to sepsis 5. Essential hypertension - continue usual medications 6. Hyperlipidemia on niacin and Crestor 7. Gastroesophageal reflux disease on Protonix 8. Elevated troponin secondary to sepsis not heart disease  Code Status:     Code Status Orders        Start     Ordered   07/08/15 1944  Do not attempt  resuscitation (DNR)   Continuous    Question Answer Comment  In the event of cardiac or respiratory ARREST Do not call a "code blue"   In the event of cardiac or respiratory ARREST Do not perform Intubation, CPR, defibrillation or ACLS   In the event of cardiac or respiratory ARREST Use medication by any route, position, wound care, and other measures to relive pain and suffering. May use oxygen, suction and manual treatment of airway obstruction as needed for comfort.      07/08/15 1943    Code Status History    Date Active Date Inactive Code Status Order ID Comments User Context   07/08/2015  7:42 PM 07/08/2015  7:43 PM Full Code [id number]  Vaughan Basta, MD ED   05/31/2015  4:55 PM 06/02/2015  2:55 PM DNR [id number]  Haynes Dage, RN Inpatient   05/31/2015  1:02 PM 05/31/2015  4:55 PM Full Code [id number]  [name], MD Inpatient    Advance Directive Documentation        Most Recent Value   Type of Advance Directive  Healthcare Power of [place], Living will   Pre-existing out of facility DNR order (yellow form or pink MOST form)     "MOST" Form in Place?       Disposition Plan: Potentially home tomorrow  Antibiotics:  Rocephin  Zithromax  Time spent: 25 minutes  Loletha Grayer  [facility]

## 2015-07-10 MED ORDER — SODIUM CHLORIDE 0.9 % IV BOLUS (SEPSIS)
1000.0000 mL | Freq: Once | INTRAVENOUS | Status: AC
  Administered 2015-07-10: 10:00:00 1000 mL via INTRAVENOUS

## 2015-07-10 MED ORDER — SODIUM CHLORIDE 0.9 % IV SOLN
INTRAVENOUS | Status: DC
  Administered 2015-07-10 – 2015-07-12 (×3): via INTRAVENOUS

## 2015-07-10 MED ORDER — METOPROLOL TARTRATE 25 MG PO TABS: 25.0000 mg | ORAL_TABLET | Freq: Two times a day (BID) | ORAL | Status: DC

## 2015-07-10 NOTE — Plan of Care (Signed)
Problem: Safety: Goal: Ability to remain free from injury will improve Outcome: Progressing Pt is a high fall risk, calls for assistance when needed. Pt is alert and oriented, family at bedside in the evening, supportive. Receiving 30 ml NS continuous IV.  Problem: Pain Managment: Goal: General experience of comfort will improve Outcome: Progressing No c/o pain at this time. No signs of discomfort.

## 2015-07-10 NOTE — Progress Notes (Signed)
Assumed pt from 1500-1900. Pt denies pain. No changes.

## 2015-07-10 NOTE — Progress Notes (Signed)
Physical Therapy Treatment Patient Details Name: ENES HUDZIK MRN: [id number] DOB: May 27, 1941 Today's Date: 07/10/2015    History of Present Illness Pt admitted for L TKR on 05/31/2015, he was discharged home and has recently been going to OP therapy at [facility]. He has been experiencing dizziness, increasing shortness of breath, and some confusion over the last month. 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 Negative scan for PEs.    PT Comments    Pt with improved tolerance to sitting and standing activity this session.  Pt reports minimal dizziness with standing which is an improvement even from earlier today when he got up with nursing.  Pt amb around nursing station ~200' holding iv pole with slow gait and some slight staggering, most likely from slow gait speed.  Pt able to do repeat sit<>stand x 5 reps without increase in dizziness.  Reviewed therex and perform supine and seated therex this session in addition to gait.  Cont with POC.   Follow Up Recommendations  Outpatient PT     Equipment Recommendations       Recommendations for Other Services       Precautions / Restrictions Precautions Precautions: None Restrictions LLE Weight Bearing: Weight bearing as tolerated    Mobility  Bed Mobility Overal bed mobility: Independent                Transfers Overall transfer level: Independent               General transfer comment: slight dizziness upon standing; no assistive device needed.  Ambulation/Gait Ambulation/Gait assistance: Modified independent (Device/Increase time) Ambulation Distance (Feet): 200 Feet Assistive device: None Gait Pattern/deviations: Antalgic     General Gait Details: Slow amb around nursing station holding IV pole with no increase in dizziness.  Gait slow with staggering steps on 2 occasions, most likely due to slow gait speed v. balance deficit.   Stairs            Wheelchair  Mobility    Modified Rankin (Stroke Patients Only)       Balance Overall balance assessment: Modified Independent                                  Cognition Arousal/Alertness: Awake/alert Behavior During Therapy: WFL for tasks assessed/performed Overall Cognitive Status: Within Functional Limits for tasks assessed                      Exercises Other Exercises Other Exercises: Supine SLR focusing on maintaining terminal knee extension x 15 reps; seated LAQ, AP, toe and heel raises x 15 reps each; sit<>stand x 5 reps continuous.    General Comments General comments (skin integrity, edema, etc.): BRB from L knee incision; bandaid in place, bleeding stopped spontaneously.      Pertinent Vitals/Pain Pain Assessment: No/denies pain    Home Living                      Prior Function            PT Goals (current goals can now be found in the care plan section) Acute Rehab PT Goals Patient Stated Goal: To return home safely  PT Goal Formulation: With patient Time For Goal Achievement: 07/23/15 Potential to Achieve Goals: Good    Frequency  Min 2X/week    PT Plan  Current plan remains appropriate    Co-evaluation             End of Session Equipment Utilized During Treatment: Gait belt Activity Tolerance: Patient tolerated treatment well Patient left: in bed;with call bell/phone within reach;with family/visitor present     Time: 1322-1350 PT Time Calculation (min) (ACUTE ONLY): 28 min  Charges:  $Gait Training: 8-22 mins $Therapeutic Exercise: 8-22 mins                    G Codes:      Pookela Sellin A Conan Mcmanaway 2015-07-29, 1:56 PM

## 2015-07-10 NOTE — Progress Notes (Signed)
Patient ID: Derek Parker, male   DOB: Jan 06, 1941, 75 y.o.   MRN: [id number] [facility] PROGRESS NOTE  Derek Parker [id number] DOB: 1940/07/15 DOA: 07/08/2015 PCP: Juluis Pitch, MD  HPI/Subjective: Patient came in with some disorientation and dizziness shortness of breath with ambulation. He feels better with regards to his breathing. Some cough. Patient orthostatic last night and again this morning.  Objective: Filed Vitals:   07/10/15 0832 07/10/15 0836  BP: 80/28 78/39  Pulse: 109 106  Temp:    Resp:      Filed Weights   07/08/15 1645 07/09/15 1709  Weight: 87.998 kg (194 lb) 94.212 kg (207 lb 11.2 oz)    ROS: Review of Systems  Constitutional: Negative for fever and chills.  Eyes: Negative for blurred vision.  Respiratory: Positive for cough and shortness of breath.   Cardiovascular: Negative for chest pain.  Gastrointestinal: Negative for nausea, vomiting, abdominal pain, diarrhea and constipation.  Genitourinary: Negative for dysuria.  Musculoskeletal: Negative for joint pain.  Neurological: Negative for dizziness and headaches.   Exam: Physical Exam  HENT:  Nose: No mucosal edema.  Mouth/Throat: No oropharyngeal exudate or posterior oropharyngeal edema.  Eyes: Conjunctivae, EOM and lids are normal. Pupils are equal, round, and reactive to light.  Neck: No JVD present. Carotid bruit is not present. No edema present. No thyroid mass and no thyromegaly present.  Cardiovascular: S1 normal and S2 normal.  Exam reveals no gallop.   No murmur heard. Pulses:      Dorsalis pedis pulses are 2+ on the right side, and 2+ on the left side.  Respiratory: No respiratory distress. He has no wheezes. He has no rhonchi. He has no rales.  GI: Soft. Bowel sounds are normal. There is no tenderness.  Musculoskeletal:       Right shoulder: He exhibits no swelling.  Lymphadenopathy:    He has no cervical adenopathy.  Neurological: He is alert. No cranial nerve  deficit.  Skin: Skin is warm. No rash noted. Nails show no clubbing.  Psychiatric: He has a normal mood and affect.    Data Reviewed: Basic Metabolic Panel:  Recent Labs Lab 07/08/15 1652 07/08/15 2257 07/09/15 0443  NA 136  --  131*  K 5.0  --  4.3  CL 104  --  105  CO2 21*  --  21*  GLUCOSE 268*  --  132*  BUN 24*  --  23*  CREATININE 1.63* 1.38* 1.15  CALCIUM 9.2  --  7.8*   CBC:  Recent Labs Lab 07/08/15 1652 07/08/15 2257 07/09/15 0443  WBC 10.2 5.5 5.6  NEUTROABS 8.1*  --   --   HGB 12.6* 10.0* 9.2*  HCT 39.0* 30.3* 28.2*  MCV 81.2 80.6 80.8  PLT 257 173 150   Cardiac Enzymes:  Recent Labs Lab 07/08/15 1652  TROPONINI 0.09*    Recent Results (from the past 240 hour(s))  Blood Culture (routine x 2)     Status: None (Preliminary result)   Collection Time: 07/08/15  7:11 PM  Result Value Ref Range Status   Specimen Description BLOOD LEFT ASSIST CONTROL  Final   Special Requests BOTTLES DRAWN AEROBIC AND ANAEROBIC 8CC  Final   Culture NO GROWTH < 12 HOURS  Final   Report Status PENDING  Incomplete  Blood Culture (routine x 2)     Status: None (Preliminary result)   Collection Time: 07/08/15  7:11 PM  Result Value Ref Range Status   Specimen Description  BLOOD RIGHT ASSIST CONTROL  Final   Special Requests   Final    BOTTLES DRAWN AEROBIC AND ANAEROBIC [place] AERO 5CC ANA   Culture NO GROWTH < 12 HOURS  Final   Report Status PENDING  Incomplete  Urine culture     Status: None (Preliminary result)   Collection Time: 07/09/15  7:24 AM  Result Value Ref Range Status   Specimen Description URINE, RANDOM  Final   Special Requests NONE  Final   Culture NO GROWTH < 24 HOURS  Final   Report Status PENDING  Incomplete     Studies: Dg Chest 1 View  07/08/2015  CLINICAL DATA:  Shortness of breath, tachycardia EXAM: CHEST 1 VIEW COMPARISON:  10/30/2014 FINDINGS: Cardiomediastinal silhouette is stable. Status post CABG. No acute infiltrate or pleural effusion.  No pulmonary edema. Chronic right AC joint separation is stable. IMPRESSION: No active disease. Electronically Signed   By: Lahoma Crocker M.D.   On: 07/08/2015 17:15   Ct Angio Chest Pe W/cm &/or Wo Cm  07/08/2015  CLINICAL DATA:  Stage IV prostate cancer. Shortness of breath and tachycardia. Recent knee surgery. EXAM: CT ANGIOGRAPHY CHEST WITH CONTRAST TECHNIQUE: Multidetector CT imaging of the chest was performed using the standard protocol during bolus administration of intravenous contrast. Multiplanar CT image reconstructions and MIPs were obtained to evaluate the vascular anatomy. CONTRAST:  71mL OMNIPAQUE IOHEXOL 350 MG/ML SOLN COMPARISON:  Chest radiograph from earlier today. NaF PET-CT from 07/29/2014. FINDINGS: Mediastinum/Nodes: The study is moderate quality for the evaluation of pulmonary embolism, limited by motion artifact, which limits evaluation of the segmental and subsegmental pulmonary artery branches, particularly in the left lung. There are no filling defects in the central, lobar, segmental or subsegmental pulmonary artery branches to suggest acute pulmonary embolism. Great vessels are normal in course and caliber. Normal heart size. No pericardial fluid/thickening. Left main, left anterior descending, left circumflex and right coronary atherosclerosis, status post CABG with left internal mammary and ascending aortic coronary bypass grafts. Normal visualized thyroid. Normal esophagus. No pathologically enlarged axillary, mediastinal or hilar lymph nodes. Lungs/Pleura: No pneumothorax. No pleural effusion. Mild centrilobular emphysema. There are patchy regions of nodular consolidation and ground-glass opacity in the right middle lobe and basilar left lower lobe, which appear new, with the largest nodular foci measuring 9 mm in the medial right middle lobe (series 6/ image 95) and 7 mm in the left lower lobe (series 6/ image 109). Upper abdomen: Cholecystectomy. Musculoskeletal: Median sternotomy  wires appear aligned and intact. Extensive patchy sclerotic osseous metastases throughout the entire thoracic skeleton appear stable to slightly increased since 07/29/2014, for example a new sclerotic T8 vertebral metastasis (series 6/image 80). Review of the MIP images confirms the above findings. IMPRESSION: 1. No evidence of acute pulmonary embolism, with mild limitations as described. 2. New patchy regions of nodular consolidation and ground-glass opacity in the right middle lobe and basilar left lower lobe, favor a nonspecific infectious or inflammatory pneumonitis/bronchopneumonia. Recommend follow-up chest CT in 3 months. 3. 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 4. Coronary atherosclerosis status post CABG. Mild centrilobular emphysema. Electronically Signed   By: Ilona Sorrel M.D.   On: 07/08/2015 18:02    Scheduled Meds: . alfuzosin  10 mg Oral Daily  . azithromycin  250 mg Intravenous Q24H  . calcium-vitamin D  1 tablet Oral BID AC  . cefTRIAXone (ROCEPHIN)  IV  1 g Intravenous Q24H  .  clopidogrel  75 mg Oral Daily  . darifenacin  7.5 mg Oral Daily  . enoxaparin (LOVENOX) injection  40 mg Subcutaneous Q24H  . ferrous fumarate  1 tablet Oral BID  . niacin  1,000 mg Oral QHS  . niacin  500 mg Oral Daily  . pantoprazole  40 mg Oral QAC breakfast  . rosuvastatin  20 mg Oral Daily  . sodium chloride  1,000 mL Intravenous Once  . sodium chloride  3 mL Intravenous Q12H  . venlafaxine XR  225 mg Oral Daily   Continuous Infusions: . sodium chloride      Assessment/Plan:  1. Clinical sepsis, pneumonia seen on CT scan. Patient started on Rocephin and Zithromax. 2. Orthostatic hypotension. DC metoprolol. Give IV fluid bolus continue to check orthostatic vital signs. Potential discharge tomorrow. May also have to d/c alfuzosin. 3. Metastatic prostate cancer to bone- seen by oncology. Follow-up  as outpatient 4. History of coronary artery disease on aspirin and Plavix metoprolol and statin 5. Sinus tachycardia- secondary to sepsis 6. Essential hypertension - continue usual medications 7. Hyperlipidemia on niacin and Crestor 8. Gastroesophageal reflux disease on Protonix 9. Elevated troponin secondary to sepsis not heart disease  Code Status:     Code Status Orders        Start     Ordered   07/08/15 1944  Do not attempt resuscitation (DNR)   Continuous    Question Answer Comment  In the event of cardiac or respiratory ARREST Do not call a "code blue"   In the event of cardiac or respiratory ARREST Do not perform Intubation, CPR, defibrillation or ACLS   In the event of cardiac or respiratory ARREST Use medication by any route, position, wound care, and other measures to relive pain and suffering. May use oxygen, suction and manual treatment of airway obstruction as needed for comfort.      07/08/15 1943    Code Status History    Date Active Date Inactive Code Status Order ID Comments User Context   07/08/2015  7:42 PM 07/08/2015  7:43 PM Full Code [id number]  Vaughan Basta, MD ED   05/31/2015  4:55 PM 06/02/2015  2:55 PM DNR [id number]  Haynes Dage, RN Inpatient   05/31/2015  1:02 PM 05/31/2015  4:55 PM Full Code [id number]  [name], MD Inpatient    Advance Directive Documentation        Most Recent Value   Type of Advance Directive  Healthcare Power of [place], Living will   Pre-existing out of facility DNR order (yellow form or pink MOST form)     "MOST" Form in Place?       Disposition Plan: Potentially home tomorrow  Antibiotics:  Rocephin  Zithromax  Time spent: 20 minutes  Derek Parker  [facility] Hospitalists

## 2015-07-11 LAB — URINE CULTURE: CULTURE: NO GROWTH

## 2015-07-11 LAB — BASIC METABOLIC PANEL
Anion gap: 8 (ref 5–15)
BUN: 11 mg/dL (ref 6–20)
CHLORIDE: 100 mmol/L — AB (ref 101–111)
CO2: 20 mmol/L — AB (ref 22–32)
CREATININE: 0.75 mg/dL (ref 0.61–1.24)
Calcium: 8.5 mg/dL — ABNORMAL LOW (ref 8.9–10.3)
GFR calc Af Amer: >60 mL/min (ref >60)
GFR calc non Af Amer: >60 mL/min (ref >60)
Glucose, Bld: 153 mg/dL — ABNORMAL HIGH (ref 65–99)
Potassium: 4 mmol/L (ref 3.5–5.1)
Sodium: 128 mmol/L — ABNORMAL LOW (ref 135–145)

## 2015-07-11 LAB — HEMOGLOBIN: Hemoglobin: 10 g/dL — ABNORMAL LOW (ref 13.0–18.0)

## 2015-07-11 MED ORDER — AZITHROMYCIN 250 MG PO TABS
250.0000 mg | ORAL_TABLET | Freq: Every day | ORAL | Status: AC
  Administered 2015-07-11 – 2015-07-12 (×2): 250 mg via ORAL
  Filled 2015-07-11 (×2): qty 1

## 2015-07-11 MED ORDER — METOPROLOL TARTRATE 1 MG/ML IV SOLN
5.0000 mg | Freq: Once | INTRAVENOUS | Status: AC
  Administered 2015-07-11: 08:00:00 5 mg via INTRAVENOUS
  Filled 2015-07-11: qty 5

## 2015-07-11 MED ORDER — METOPROLOL TARTRATE 25 MG PO TABS
25.0000 mg | ORAL_TABLET | Freq: Two times a day (BID) | ORAL | Status: DC
  Administered 2015-07-11 – 2015-07-12 (×2): 25 mg via ORAL
  Filled 2015-07-11 (×2): qty 1

## 2015-07-11 MED ORDER — METOPROLOL TARTRATE 50 MG PO TABS
75.0000 mg | ORAL_TABLET | Freq: Two times a day (BID) | ORAL | Status: DC
  Administered 2015-07-11: 11:00:00 75 mg via ORAL
  Filled 2015-07-11: qty 1

## 2015-07-11 NOTE — Progress Notes (Signed)
PHARMACIST - PHYSICIAN COMMUNICATION  CONCERNING: IV to Oral Route Change Policy  RECOMMENDATION: This patient is receiving AZITHROMYCIN by the intravenous route.  Based on criteria approved by the Pharmacy and Therapeutics Committee, the intravenous medication(s) is/are being converted to the equivalent oral dose form(s).   DESCRIPTION: These criteria include:  The patient is eating (either orally or via tube) and/or has been taking other orally administered medications for a least 24 hours  The patient has no evidence of active gastrointestinal bleeding or impaired GI absorption (gastrectomy, short bowel, patient on TNA or NPO).  If you have questions about this conversion, please contact the Pharmacy Department  []   [phone] )  [facility] [x]   [phone] )  [facility] []   [phone] )  [facility] []   [phone] )  [facility] []   [phone] )  [place] PharmD Clinical Pharmacist 07/11/2015 4:55 PM

## 2015-07-11 NOTE — Progress Notes (Signed)
Patient ID: Derek Parker, male   DOB: Dec 29, 1940, 75 y.o.   MRN: [id number] [facility] PROGRESS NOTE  Derek Parker [id number] DOB: April 21, 1941 DOA: 07/08/2015 PCP: Juluis Pitch, MD  HPI/Subjective: Patient not feeling good today at all. Yesterday was orthostatic. This am tachycardic. Breathing okay, some cough and stuffy nose   Objective: Filed Vitals:   07/11/15 0928 07/11/15 1101  BP: 125/68 147/80  Pulse: 112 112  Temp:    Resp:  18    Filed Weights   07/08/15 1645 07/09/15 1709  Weight: 87.998 kg (194 lb) 94.212 kg (207 lb 11.2 oz)    ROS: Review of Systems  Constitutional: Negative for fever and chills.  Eyes: Negative for blurred vision.  Respiratory: Positive for cough and shortness of breath.   Cardiovascular: Negative for chest pain.  Gastrointestinal: Negative for nausea, vomiting, abdominal pain, diarrhea and constipation.  Genitourinary: Negative for dysuria.  Musculoskeletal: Negative for joint pain.  Neurological: Negative for dizziness and headaches.   Exam: Physical Exam  HENT:  Nose: No mucosal edema.  Mouth/Throat: No oropharyngeal exudate or posterior oropharyngeal edema.  Eyes: Conjunctivae, EOM and lids are normal. Pupils are equal, round, and reactive to light.  Neck: No JVD present. Carotid bruit is not present. No edema present. No thyroid mass and no thyromegaly present.  Cardiovascular: S1 normal and S2 normal.  Tachycardia present.  Exam reveals no gallop.   No murmur heard. Pulses:      Dorsalis pedis pulses are 2+ on the right side, and 2+ on the left side.  Respiratory: No respiratory distress. He has no wheezes. He has rhonchi in the right lower field and the left lower field. He has no rales.  GI: Soft. Bowel sounds are normal. There is no tenderness.  Musculoskeletal:       Right shoulder: He exhibits no swelling.  Lymphadenopathy:    He has no cervical adenopathy.  Neurological: He is alert. No cranial nerve  deficit.  Skin: Skin is warm. No rash noted. Nails show no clubbing.  Psychiatric: He has a normal mood and affect.    Data Reviewed: Basic Metabolic Panel:  Recent Labs Lab 07/08/15 1652 07/08/15 2257 07/09/15 0443 07/11/15 0423  NA 136  --  131* 128*  K 5.0  --  4.3 4.0  CL 104  --  105 100*  CO2 21*  --  21* 20*  GLUCOSE 268*  --  132* 153*  BUN 24*  --  23* 11  CREATININE 1.63* 1.38* 1.15 0.75  CALCIUM 9.2  --  7.8* 8.5*   CBC:  Recent Labs Lab 07/08/15 1652 07/08/15 2257 07/09/15 0443 07/11/15 0423  WBC 10.2 5.5 5.6  --   NEUTROABS 8.1*  --   --   --   HGB 12.6* 10.0* 9.2* 10.0*  HCT 39.0* 30.3* 28.2*  --   MCV 81.2 80.6 80.8  --   PLT 257 173 150  --    Cardiac Enzymes:  Recent Labs Lab 07/08/15 1652  TROPONINI 0.09*    Recent Results (from the past 240 hour(s))  Blood Culture (routine x 2)     Status: None (Preliminary result)   Collection Time: 07/08/15  7:11 PM  Result Value Ref Range Status   Specimen Description BLOOD LEFT ASSIST CONTROL  Final   Special Requests BOTTLES DRAWN AEROBIC AND ANAEROBIC 8CC  Final   Culture NO GROWTH 2 DAYS  Final   Report Status PENDING  Incomplete  Blood Culture (  routine x 2)     Status: None (Preliminary result)   Collection Time: 07/08/15  7:11 PM  Result Value Ref Range Status   Specimen Description BLOOD RIGHT ASSIST CONTROL  Final   Special Requests   Final    BOTTLES DRAWN AEROBIC AND ANAEROBIC [place] AERO 5CC ANA   Culture NO GROWTH 2 DAYS  Final   Report Status PENDING  Incomplete  Urine culture     Status: None   Collection Time: 07/09/15  7:24 AM  Result Value Ref Range Status   Specimen Description URINE, RANDOM  Final   Special Requests NONE  Final   Culture NO GROWTH 2 DAYS  Final   Report Status 07/11/2015 FINAL  Final      Scheduled Meds: . azithromycin  250 mg Intravenous Q24H  . calcium-vitamin D  1 tablet Oral BID AC  . cefTRIAXone (ROCEPHIN)  IV  1 g Intravenous Q24H  . clopidogrel   75 mg Oral Daily  . darifenacin  7.5 mg Oral Daily  . enoxaparin (LOVENOX) injection  40 mg Subcutaneous Q24H  . ferrous fumarate  1 tablet Oral BID  . metoprolol tartrate  75 mg Oral BID  . niacin  1,000 mg Oral QHS  . niacin  500 mg Oral Daily  . pantoprazole  40 mg Oral QAC breakfast  . rosuvastatin  20 mg Oral Daily  . sodium chloride  3 mL Intravenous Q12H  . venlafaxine XR  225 mg Oral Daily   Continuous Infusions: . sodium chloride 50 mL/hr at 07/11/15 1106    Assessment/Plan:  1. Clinical sepsis, pneumonia seen on CT scan. Patient started on Rocephin and Zithromax. 2. Sinus Tachycardia- Patient given 5 mg iv metoprolol and restarted on oral metoprolol. Re-evaluated tomorrow. I just checked with telemetry and he is sinus rhythm 85bpm. 3. Orthostatic hypotension. Continue to check orthostatic vital signs. D/c alfuzosin. 4. Metastatic prostate cancer to bone- seen by oncology. Follow-up as outpatient 5. History of coronary artery disease on aspirin and Plavix metoprolol and statin 6. Hyperlipidemia on niacin and Crestor 7. Gastroesophageal reflux disease on Protonix 8. Elevated troponin secondary to sepsis not heart disease  Code Status:     Code Status Orders        Start     Ordered   07/08/15 1944  Do not attempt resuscitation (DNR)   Continuous    Question Answer Comment  In the event of cardiac or respiratory ARREST Do not call a "code blue"   In the event of cardiac or respiratory ARREST Do not perform Intubation, CPR, defibrillation or ACLS   In the event of cardiac or respiratory ARREST Use medication by any route, position, wound care, and other measures to relive pain and suffering. May use oxygen, suction and manual treatment of airway obstruction as needed for comfort.      07/08/15 1943    Code Status History    Date Active Date Inactive Code Status Order ID Comments User Context   07/08/2015  7:42 PM 07/08/2015  7:43 PM Full Code [id number]  Vaughan Basta, MD ED   05/31/2015  4:55 PM 06/02/2015  2:55 PM DNR [id number]  Haynes Dage, RN Inpatient   05/31/2015  1:02 PM 05/31/2015  4:55 PM Full Code [id number]  [name], MD Inpatient    Advance Directive Documentation        Most Recent Value   Type of Advance Directive  Healthcare Power of [place], Living will  Pre-existing out of facility DNR order (yellow form or pink MOST form)     "MOST" Form in Place?       Disposition Plan: Potentially home tomorrow  Antibiotics:  Rocephin  Zithromax  Time spent: 20 minutes  Loletha Grayer  [facility]

## 2015-07-11 NOTE — Plan of Care (Signed)
Problem: Physical Regulation: Goal: Ability to maintain clinical measurements within normal limits will improve Outcome: Progressing In am HR in the 130's at rest, asymptomatic. Metoprolol IV given by ICU RN with improvement. Positive orthostatics -hypotension. Pt was not able to tolerate standing, reported nausea and dizziness. MD notified, metoprolol PO dose adjusted. Pt declined nausea med.   Problem: Activity: Goal: Ability to tolerate increased activity will improve Outcome: Not Progressing Generalized weakness. Orthostatic hypotension. Unable to tolerate standing due to nausea and dizziness.

## 2015-07-11 NOTE — Plan of Care (Signed)
Problem: Safety: Goal: Ability to remain free from injury will improve Outcome: Progressing Pt is a high fall risk, encouraged to call out for assistance when needed. Utilizes urinal at bedside.  Problem: Pain Managment: Goal: General experience of comfort will improve Outcome: Progressing Pt has no complains of pain or discomfort.  Problem: Fluid Volume: Goal: Ability to maintain a balanced intake and output will improve Outcome: Progressing Pt continues to receive NS at 50 ml/hr. Vital signs remain stable.  Continues on a heart healthy diet.

## 2015-07-11 NOTE — Progress Notes (Signed)
MD notified patient has a HR in the 130's, asymptomatic, BP 153/87. Metoprolol IV ordered, 5 mg.

## 2015-07-12 MED ORDER — ACETAMINOPHEN 325 MG PO TABS: 650.0000 mg | ORAL_TABLET | Freq: Four times a day (QID) | ORAL | Status: DC | PRN

## 2015-07-12 MED ORDER — FLUDROCORTISONE ACETATE 0.1 MG PO TABS
0.1000 mg | ORAL_TABLET | Freq: Every day | ORAL | Status: DC
  Administered 2015-07-12: 0.1 mg via ORAL
  Filled 2015-07-12: qty 1

## 2015-07-12 MED ORDER — FLUDROCORTISONE ACETATE 0.1 MG PO TABS
0.1000 mg | ORAL_TABLET | Freq: Two times a day (BID) | ORAL | Status: DC
  Administered 2015-07-12 – 2015-07-14 (×4): 0.1 mg via ORAL
  Filled 2015-07-12 (×4): qty 1

## 2015-07-12 MED ORDER — METOPROLOL TARTRATE 25 MG PO TABS
12.5000 mg | ORAL_TABLET | Freq: Two times a day (BID) | ORAL | Status: DC
  Administered 2015-07-12 – 2015-07-14 (×4): 12.5 mg via ORAL
  Filled 2015-07-12 (×4): qty 1

## 2015-07-12 NOTE — Care Management Important Message (Signed)
Important Message  Patient Details  Name: Derek Parker MRN: [id number] Date of Birth: 24-Oct-1940   Medicare Important Message Given:  Yes    Juliann Pulse A Danile Trier 07/12/2015, 10:19 AM

## 2015-07-12 NOTE — Progress Notes (Signed)
Physical Therapy Treatment Patient Details Name: Derek Parker MRN: [id number] DOB: 11/15/1940 Today's Date: 07/12/2015    History of Present Illness Pt admitted for L TKR on 05/31/2015, he was discharged home and has recently been going to OP therapy at [facility]. He has been experiencing dizziness, increasing shortness of breath, and some confusion over the last month. 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 Negative scan for PEs.     PT Comments    Pt with good tolerance to session today, no orthostatic episodes.  Performed standing and seated therex for strengthening.  Pt required occasional theraputic rest due to increased fatigue, pt with noted decreased eccentric control with transfers and therex sit to stand.    Follow Up Recommendations  Outpatient PT     Equipment Recommendations       Recommendations for Other Services       Precautions / Restrictions Precautions Precautions: None Restrictions Weight Bearing Restrictions: No LLE Weight Bearing: Weight bearing as tolerated    Mobility  Bed Mobility Overal bed mobility: Independent                Transfers Overall transfer level: Independent               General transfer comment: No AD needed decreased eccentric control with sitting   Ambulation/Gait Ambulation/Gait assistance: Modified independent (Device/Increase time) Ambulation Distance (Feet): 200 Feet Assistive device: Rolling walker (2 wheeled) Gait Pattern/deviations: Antalgic     General Gait Details: slow antalgice gait with step through patter   Stairs            Wheelchair Mobility    Modified Rankin (Stroke Patients Only)       Balance Overall balance assessment: Modified Independent                                  Cognition Arousal/Alertness: Awake/alert Behavior During Therapy: WFL for tasks assessed/performed Overall Cognitive Status: Within Functional  Limits for tasks assessed                      Exercises Other Exercises Other Exercises: Standing hs curls, manually resisited LAQ, standing heel raises, sit<>stand x 6 with cues for increased eccentric control     General Comments        Pertinent Vitals/Pain Pain Assessment: No/denies pain    Home Living                      Prior Function            PT Goals (current goals can now be found in the care plan section) Acute Rehab PT Goals Patient Stated Goal: To return home safely     Frequency  Min 2X/week    PT Plan Current plan remains appropriate    Co-evaluation             End of Session Equipment Utilized During Treatment: Gait belt Activity Tolerance: Patient tolerated treatment well Patient left: in chair;with call bell/phone within reach;with chair alarm set;with family/visitor present     Time: 1430-1500 PT Time Calculation (min) (ACUTE ONLY): 30 min  Charges:  $Gait Training: 8-22 mins $Therapeutic Exercise: 8-22 mins                    G Codes:      Dashawn Bartnick  Rainelle Sulewski 07/12/2015, 3:13 PM Forbes Loll, PTA

## 2015-07-12 NOTE — Plan of Care (Signed)
Problem: Safety: Goal: Ability to remain free from injury will improve Outcome: Progressing Pt is high fall risk, alert and oriented. Calls for assistance. Unsteady when standing, assistance needed. Orthostatic vital signs improved in the evening, tachycardic when standing.  Pt is NSR with BBB.  Receiving NS at 50 ml/hr.   Problem: Pain Management: Goal: Expressions of feelings of enhanced comfort will increase Outcome: Progressing Pt has no complaints of pain at this time. Continue to assess.

## 2015-07-12 NOTE — Progress Notes (Signed)
Patient ID: Derek Parker, male   DOB: 11-05-1940, 75 y.o.   MRN: [id number] Patient ID: Derek Parker, male   DOB: Oct 10, 1940, 75 y.o.   MRN: [id number] [facility] PROGRESS NOTE  Derek Parker [id number] DOB: [birth year] DOA: 07/08/2015 PCP: Juluis Pitch, MD  HPI/Subjective: Patient not feeling good today at all. Yesterday was orthostatic. This am tachycardic. Breathing okay, some cough and stuffy nose   Objective: Filed Vitals:   07/12/15 1241 07/12/15 1257  BP: 108/57 82/56  Pulse: 93 116  Temp: 98.5 F (36.9 C)   Resp: 18     Filed Weights   07/08/15 1645 07/09/15 1709  Weight: 87.998 kg (194 lb) 94.212 kg (207 lb 11.2 oz)    ROS: Review of Systems  Constitutional: Negative for fever and chills.  Eyes: Negative for blurred vision.  Respiratory: Positive for shortness of breath. Negative for cough.   Cardiovascular: Negative for chest pain.  Gastrointestinal: Negative for nausea, vomiting, abdominal pain, diarrhea and constipation.  Genitourinary: Negative for dysuria.  Musculoskeletal: Negative for joint pain.  Neurological: Positive for dizziness and weakness. Negative for headaches.   Exam: Physical Exam  HENT:  Nose: No mucosal edema.  Mouth/Throat: No oropharyngeal exudate or posterior oropharyngeal edema.  Eyes: Conjunctivae, EOM and lids are normal. Pupils are equal, round, and reactive to light.  Neck: No JVD present. Carotid bruit is not present. No edema present. No thyroid mass and no thyromegaly present.  Cardiovascular: S1 normal and S2 normal.  Tachycardia present.  Exam reveals no gallop.   No murmur heard. Pulses:      Dorsalis pedis pulses are 2+ on the right side, and 2+ on the left side.  Respiratory: No respiratory distress. He has no wheezes. He has no rhonchi. He has no rales.  GI: Soft. Bowel sounds are normal. There is no tenderness.  Musculoskeletal:       Right shoulder: He exhibits no swelling.  Lymphadenopathy:     He has no cervical adenopathy.  Neurological: He is alert. No cranial nerve deficit.  Skin: Skin is warm. No rash noted. Nails show no clubbing.  Psychiatric: He has a normal mood and affect.    Data Reviewed: Basic Metabolic Panel:  Recent Labs Lab 07/08/15 1652 07/08/15 2257 07/09/15 0443 07/11/15 0423  NA 136  --  131* 128*  K 5.0  --  4.3 4.0  CL 104  --  105 100*  CO2 21*  --  21* 20*  GLUCOSE 268*  --  132* 153*  BUN 24*  --  23* 11  CREATININE 1.63* 1.38* 1.15 0.75  CALCIUM 9.2  --  7.8* 8.5*   CBC:  Recent Labs Lab 07/08/15 1652 07/08/15 2257 07/09/15 0443 07/11/15 0423  WBC 10.2 5.5 5.6  --   NEUTROABS 8.1*  --   --   --   HGB 12.6* 10.0* 9.2* 10.0*  HCT 39.0* 30.3* 28.2*  --   MCV 81.2 80.6 80.8  --   PLT 257 173 150  --    Cardiac Enzymes:  Recent Labs Lab 07/08/15 1652  TROPONINI 0.09*    Recent Results (from the past 240 hour(s))  Blood Culture (routine x 2)     Status: None (Preliminary result)   Collection Time: 07/08/15  7:11 PM  Result Value Ref Range Status   Specimen Description BLOOD LEFT ASSIST CONTROL  Final   Special Requests BOTTLES DRAWN AEROBIC AND ANAEROBIC 8CC  Final   Culture NO GROWTH  3 DAYS  Final   Report Status PENDING  Incomplete  Blood Culture (routine x 2)     Status: None (Preliminary result)   Collection Time: 07/08/15  7:11 PM  Result Value Ref Range Status   Specimen Description BLOOD RIGHT ASSIST CONTROL  Final   Special Requests   Final    BOTTLES DRAWN AEROBIC AND ANAEROBIC [place] AERO 5CC ANA   Culture NO GROWTH 3 DAYS  Final   Report Status PENDING  Incomplete  Urine culture     Status: None   Collection Time: 07/09/15  7:24 AM  Result Value Ref Range Status   Specimen Description URINE, RANDOM  Final   Special Requests NONE  Final   Culture NO GROWTH 2 DAYS  Final   Report Status 07/11/2015 FINAL  Final      Scheduled Meds: . azithromycin  250 mg Oral Daily  . calcium-vitamin D  1 tablet Oral BID AC   . cefTRIAXone (ROCEPHIN)  IV  1 g Intravenous Q24H  . clopidogrel  75 mg Oral Daily  . darifenacin  7.5 mg Oral Daily  . enoxaparin (LOVENOX) injection  40 mg Subcutaneous Q24H  . ferrous fumarate  1 tablet Oral BID  . fludrocortisone  0.1 mg Oral BID  . metoprolol tartrate  12.5 mg Oral BID  . niacin  1,000 mg Oral QHS  . niacin  500 mg Oral Daily  . pantoprazole  40 mg Oral QAC breakfast  . rosuvastatin  20 mg Oral Daily  . sodium chloride  3 mL Intravenous Q12H  . venlafaxine XR  225 mg Oral Daily    Assessment/Plan:  1. Clinical sepsis, pneumonia seen on CT scan. Patient started on Rocephin and Zithromax. zithromax will finish today. 2. Sinus Tachycardia- Patient need some dose of metoprolol- decrease to 12.5 mg po bid since still orthostatic 3. Orthostatic hypotension. Continue to check orthostatic vital signs. D/c alfuzosin. Start florinef. 4. Hyponatremia- sodium worsened with ivf.  Fluids stopped 5. Metastatic prostate cancer to bone- seen by oncology. Follow-up as outpatient 6. History of coronary artery disease on Plavix metoprolol and statin 7. Hyperlipidemia on niacin and Crestor 8. Gastroesophageal reflux disease on Protonix 9. Elevated troponin secondary to sepsis not heart disease  Code Status:     Code Status Orders        Start     Ordered   07/08/15 1944  Do not attempt resuscitation (DNR)   Continuous    Question Answer Comment  In the event of cardiac or respiratory ARREST Do not call a "code blue"   In the event of cardiac or respiratory ARREST Do not perform Intubation, CPR, defibrillation or ACLS   In the event of cardiac or respiratory ARREST Use medication by any route, position, wound care, and other measures to relive pain and suffering. May use oxygen, suction and manual treatment of airway obstruction as needed for comfort.      07/08/15 1943    Code Status History    Date Active Date Inactive Code Status Order ID Comments User Context    07/08/2015  7:42 PM 07/08/2015  7:43 PM Full Code [id number]  Vaughan Basta, MD ED   05/31/2015  4:55 PM 06/02/2015  2:55 PM DNR [id number]  Haynes Dage, RN Inpatient   05/31/2015  1:02 PM 05/31/2015  4:55 PM Full Code [id number]  [name], MD Inpatient    Advance Directive Documentation        Most Recent Value  Type of Advance Directive  Healthcare Power of Attorney, Living will   Pre-existing out of facility DNR order (yellow form or pink MOST form)     "MOST" Form in Place?       Disposition Plan: Potentially home tomorrow  Antibiotics:  Rocephin  Zithromax  Time spent: 20 minutes  Loletha Grayer  [facility]

## 2015-07-13 LAB — BASIC METABOLIC PANEL
ANION GAP: 11 (ref 5–15)
BUN: 15 mg/dL (ref 6–20)
CHLORIDE: 95 mmol/L — AB (ref 101–111)
CO2: 20 mmol/L — AB (ref 22–32)
Calcium: 8.1 mg/dL — ABNORMAL LOW (ref 8.9–10.3)
Creatinine, Ser: 0.87 mg/dL (ref 0.61–1.24)
GFR calc non Af Amer: >60 mL/min (ref >60)
Glucose, Bld: 109 mg/dL — ABNORMAL HIGH (ref 65–99)
POTASSIUM: 3.3 mmol/L — AB (ref 3.5–5.1)
SODIUM: 126 mmol/L — AB (ref 135–145)

## 2015-07-13 LAB — CULTURE, BLOOD (ROUTINE X 2)
CULTURE: NO GROWTH
CULTURE: NO GROWTH

## 2015-07-13 LAB — MAGNESIUM: MAGNESIUM: 1.6 mg/dL — AB (ref 1.7–2.4)

## 2015-07-13 MED ORDER — POTASSIUM CHLORIDE CRYS ER 20 MEQ PO TBCR
40.0000 meq | EXTENDED_RELEASE_TABLET | Freq: Once | ORAL | Status: AC
  Administered 2015-07-13: 40 meq via ORAL
  Filled 2015-07-13: qty 2

## 2015-07-13 MED ORDER — SODIUM CHLORIDE 0.9 % IV SOLN
INTRAVENOUS | Status: AC
  Administered 2015-07-13 (×2): via INTRAVENOUS

## 2015-07-13 MED ORDER — ENSURE ENLIVE PO LIQD
237.0000 mL | Freq: Two times a day (BID) | ORAL | Status: DC
  Administered 2015-07-13 – 2015-07-14 (×2): 237 mL via ORAL

## 2015-07-13 MED ORDER — AMOXICILLIN-POT CLAVULANATE 875-125 MG PO TABS
1.0000 | ORAL_TABLET | Freq: Two times a day (BID) | ORAL | Status: DC
Start: 2015-07-13 — End: 2015-07-14
  Administered 2015-07-13 – 2015-07-14 (×3): 1 via ORAL
  Filled 2015-07-13 (×3): qty 1

## 2015-07-13 MED ORDER — MAGNESIUM SULFATE 2 GM/50ML IV SOLN
2.0000 g | Freq: Once | INTRAVENOUS | Status: AC
  Administered 2015-07-13: 2 g via INTRAVENOUS
  Filled 2015-07-13: qty 50

## 2015-07-13 NOTE — Care Management (Signed)
Admitted to [facility] with the diagnosis of pneumonia. Lives with wife, Malachy Mood [phone]). Left Knee replacement per Dr. Marry Guan 05/31/15. Followed by [place] when discharged. Uses a rolling walker to aid in ambulation. Dr. Lovie Macadamia is listed as primary care physician.  IV Rocephin in progress. Low grade temperature = 99.2 Sodium level 126 and potassium level 3.3 today.  Wife is at the bedside. Shelbie Ammons RN MSN CCM Care Management [phone]

## 2015-07-13 NOTE — Progress Notes (Signed)
Patient ID: ISAH CATBAGAN, male   DOB: 1940/11/02, 75 y.o.   MRN: [id number] Patient ID: CAMRON ORSAK, male   DOB: 09-19-1940, 75 y.o.   MRN: [id number] [facility] PROGRESS NOTE  DAMEON AVANESSIAN [id number] DOB: 1940/07/21 DOA: 07/08/2015 PCP: Juluis Pitch, MD  HPI/Subjective: Patient has no complaint except dizziness sometimes.    Objective: Filed Vitals:   07/13/15 0433 07/13/15 0830  BP: 107/58   Pulse: 102   Temp: 98.1 F (36.7 C)   Resp: 18 22    Filed Weights   07/08/15 1645 07/09/15 1709  Weight: 87.998 kg (194 lb) 94.212 kg (207 lb 11.2 oz)    ROS: Review of Systems  Constitutional: Negative for fever and chills.  Eyes: Negative for blurred vision.  Respiratory: Negative for cough and shortness of breath.   Cardiovascular: Negative for chest pain.  Gastrointestinal: Negative for nausea, vomiting, abdominal pain, diarrhea and constipation.  Genitourinary: Negative for dysuria.  Musculoskeletal: Negative for joint pain.  Neurological: Positive for dizziness. Negative for weakness and headaches.   Exam: Physical Exam  HENT:  Nose: No mucosal edema.  Mouth/Throat: No oropharyngeal exudate or posterior oropharyngeal edema.  Eyes: Conjunctivae, EOM and lids are normal. Pupils are equal, round, and reactive to light.  Neck: No JVD present. Carotid bruit is not present. No edema present. No thyroid mass and no thyromegaly present.  Cardiovascular: S1 normal and S2 normal.  Tachycardia present.  Exam reveals no gallop.   No murmur heard. Pulses:      Dorsalis pedis pulses are 2+ on the right side, and 2+ on the left side.  Respiratory: No respiratory distress. He has no wheezes. He has no rhonchi. He has no rales.  GI: Soft. Bowel sounds are normal. There is no tenderness.  Musculoskeletal:       Right shoulder: He exhibits no swelling.  Lymphadenopathy:    He has no cervical adenopathy.  Neurological: He is alert. No cranial nerve deficit.   Skin: Skin is warm. No rash noted. Nails show no clubbing.  Psychiatric: He has a normal mood and affect.    Data Reviewed: Basic Metabolic Panel:  Recent Labs Lab 07/08/15 1652 07/08/15 2257 07/09/15 0443 07/11/15 0423 07/13/15 0602  NA 136  --  131* 128* 126*  K 5.0  --  4.3 4.0 3.3*  CL 104  --  105 100* 95*  CO2 21*  --  21* 20* 20*  GLUCOSE 268*  --  132* 153* 109*  BUN 24*  --  23* 11 15  CREATININE 1.63* 1.38* 1.15 0.75 0.87  CALCIUM 9.2  --  7.8* 8.5* 8.1*  MG  --   --   --   --  1.6*   CBC:  Recent Labs Lab 07/08/15 1652 07/08/15 2257 07/09/15 0443 07/11/15 0423  WBC 10.2 5.5 5.6  --   NEUTROABS 8.1*  --   --   --   HGB 12.6* 10.0* 9.2* 10.0*  HCT 39.0* 30.3* 28.2*  --   MCV 81.2 80.6 80.8  --   PLT 257 173 150  --    Cardiac Enzymes:  Recent Labs Lab 07/08/15 1652  TROPONINI 0.09*    Recent Results (from the past 240 hour(s))  Blood Culture (routine x 2)     Status: None   Collection Time: 07/08/15  7:11 PM  Result Value Ref Range Status   Specimen Description BLOOD LEFT ASSIST CONTROL  Final   Special Requests BOTTLES DRAWN AEROBIC  AND ANAEROBIC 8CC  Final   Culture NO GROWTH 5 DAYS  Final   Report Status 07/13/2015 FINAL  Final  Blood Culture (routine x 2)     Status: None   Collection Time: 07/08/15  7:11 PM  Result Value Ref Range Status   Specimen Description BLOOD RIGHT ASSIST CONTROL  Final   Special Requests   Final    BOTTLES DRAWN AEROBIC AND ANAEROBIC [place] AERO 5CC ANA   Culture NO GROWTH 5 DAYS  Final   Report Status 07/13/2015 FINAL  Final  Urine culture     Status: None   Collection Time: 07/09/15  7:24 AM  Result Value Ref Range Status   Specimen Description URINE, RANDOM  Final   Special Requests NONE  Final   Culture NO GROWTH 2 DAYS  Final   Report Status 07/11/2015 FINAL  Final      Scheduled Meds: . amoxicillin-clavulanate  1 tablet Oral Q12H  . calcium-vitamin D  1 tablet Oral BID AC  . clopidogrel  75 mg Oral  Daily  . darifenacin  7.5 mg Oral Daily  . enoxaparin (LOVENOX) injection  40 mg Subcutaneous Q24H  . feeding supplement (ENSURE ENLIVE)  237 mL Oral BID WC  . ferrous fumarate  1 tablet Oral BID  . fludrocortisone  0.1 mg Oral BID  . metoprolol tartrate  12.5 mg Oral BID  . niacin  1,000 mg Oral QHS  . niacin  500 mg Oral Daily  . pantoprazole  40 mg Oral QAC breakfast  . rosuvastatin  20 mg Oral Daily  . sodium chloride  3 mL Intravenous Q12H  . venlafaxine XR  225 mg Oral Daily    Assessment/Plan:  1. Clinical sepsis, pneumonia seen on CT scan. Patient started on Rocephin and Zithromax. zithromax is completed yesterday. Discontinue rocephin, change to po augmentin. 2. Sinus Tachycardia- Patient need some dose of metoprolol- decrease to 12.5 mg po bid since still orthostatic. 3. Orthostatic hypotension. Continue to check orthostatic vital signs. D/c alfuzosin. Started florinef. 4. Hyponatremia- sodium worsened, start NS iv. F/u BMP. 5. Metastatic prostate cancer to bone- seen by oncology. Follow-up as outpatient. PET scan as outpt per his oncologist. 6. History of coronary artery disease on Plavix metoprolol and statin 7. Hyperlipidemia on niacin and Crestor 8. Gastroesophageal reflux disease on Protonix 9. Elevated troponin secondary to sepsis not heart disease  Hypokalemia. KCl po.  Hypomagnesemia. Mag iv. F/u level.  Code Status:     Code Status Orders        Start     Ordered   07/08/15 1944  Do not attempt resuscitation (DNR)   Continuous    Question Answer Comment  In the event of cardiac or respiratory ARREST Do not call a "code blue"   In the event of cardiac or respiratory ARREST Do not perform Intubation, CPR, defibrillation or ACLS   In the event of cardiac or respiratory ARREST Use medication by any route, position, wound care, and other measures to relive pain and suffering. May use oxygen, suction and manual treatment of airway obstruction as needed for  comfort.      07/08/15 1943    Code Status History    Date Active Date Inactive Code Status Order ID Comments User Context   07/08/2015  7:42 PM 07/08/2015  7:43 PM Full Code [id number]  Vaughan Basta, MD ED   05/31/2015  4:55 PM 06/02/2015  2:55 PM DNR [id number]  Haynes Dage, RN Inpatient  05/31/2015  1:02 PM 05/31/2015  4:55 PM Full Code [id number]  [name], MD Inpatient    Advance Directive Documentation        Most Recent Value   Type of Advance Directive  Healthcare Power of Attorney, Living will   Pre-existing out of facility DNR order (yellow form or pink MOST form)     "MOST" Form in Place?       Disposition Plan: Potentially home tomorrow Discussed with pt and his wife, his oncologist Dr. Jimmye Norman, Gwenlyn Found. Greater than 50% time was spent on coordination of care and face-to-face counseling.  Antibiotics:  Rocephin  Zithromax  Time spent: 35 minutes  Demetrios Loll  [facility]

## 2015-07-13 NOTE — Progress Notes (Signed)
Nutrition Follow-up  DOCUMENTATION CODES:   Severe malnutrition in context of acute illness/injury  INTERVENTION:   Meals and Snacks: Cater to patient preferences; will send afternoon milkshake Medical Food Supplement Therapy: pt agreeable to Ensure Enlive po BID, each supplement provides 350 kcal and 20 grams of protein Coordination of Care: pt may benefit from stronger bowel regimen as last recorded BM 5 days ago    NUTRITION DIAGNOSIS:   Inadequate oral intake related to poor appetite as evidenced by per patient/family report.  GOAL:   Patient will meet greater than or equal to 90% of their needs; ongoing  MONITOR:    (Energy Intake, Anthropometrics, Digestive System, Electrolyte/Renal Profile)  REASON FOR ASSESSMENT:   Malnutrition Screening Tool    ASSESSMENT:      Diet Order:  Diet Heart Room service appropriate?: Yes; Fluid consistency:: Thin    Current Nutrition: Pt reports poor po intake continues. Pt ate 100% of bagel and jello this am on visit, wife ate fruit plate. Pt reports not eating dinner last night as he was with poor appetite and did not like what was sent. Pt's wife reports pt has been taking bites of meals. Recorded po intake 56% of meals on average.    Gastrointestinal Profile: Last BM: 07/08/2015   Scheduled Medications:  . amoxicillin-clavulanate  1 tablet Oral Q12H  . calcium-vitamin D  1 tablet Oral BID AC  . clopidogrel  75 mg Oral Daily  . darifenacin  7.5 mg Oral Daily  . enoxaparin (LOVENOX) injection  40 mg Subcutaneous Q24H  . feeding supplement (ENSURE ENLIVE)  237 mL Oral BID WC  . ferrous fumarate  1 tablet Oral BID  . fludrocortisone  0.1 mg Oral BID  . metoprolol tartrate  12.5 mg Oral BID  . niacin  1,000 mg Oral QHS  . niacin  500 mg Oral Daily  . pantoprazole  40 mg Oral QAC breakfast  . rosuvastatin  20 mg Oral Daily  . sodium chloride  3 mL Intravenous Q12H  . venlafaxine XR  225 mg Oral Daily    Continuous  Medications:  . sodium chloride 75 mL/hr at 07/13/15 0938     Electrolyte/Renal Profile and Glucose Profile:   Recent Labs Lab 07/09/15 0443 07/11/15 0423 07/13/15 0602  NA 131* 128* 126*  K 4.3 4.0 3.3*  CL 105 100* 95*  CO2 21* 20* 20*  BUN 23* 11 15  CREATININE 1.15 0.75 0.87  CALCIUM 7.8* 8.5* 8.1*  MG  --   --  1.6*  GLUCOSE 132* 153* 109*   Protein Profile: No results for input(s): ALBUMIN in the last 168 hours.     Weight Trend since Admission: Filed Weights   07/08/15 1645 07/09/15 1709  Weight: 194 lb (87.998 kg) 207 lb 11.2 oz (94.212 kg)     Skin:  Reviewed, no issues   BMI:  Body mass index is 33.54 kg/(m^2).  Estimated Nutritional Needs:   Kcal:  [id number] kcals (BEE 1663, 1.3 AF, 1.1-1.3 IF)   Protein:  97-114 g (1.1-1.3 g/kg)   Fluid:  2200-2640 mL(25-30 ml/kg)    MODERATE Care Level  Dwyane Luo, RD, LDN Pager [phone] Weekend/On-Call Pager [phone]

## 2015-07-13 NOTE — Plan of Care (Signed)
Problem: Safety: Goal: Ability to remain free from injury will improve Outcome: Progressing Pt remains free from injury.  Fall precautions in place.  Problem: Pain Managment: Goal: General experience of comfort will improve Outcome: Progressing No C/O of pain this shift.     Problem: Physical Regulation: Goal: Will remain free from infection Outcome: Progressing Pt continues on rocephin.

## 2015-07-14 ENCOUNTER — Other Ambulatory Visit: Payer: Self-pay | Admitting: Internal Medicine

## 2015-07-14 DIAGNOSIS — C61 Malignant neoplasm of prostate: Secondary | ICD-10-CM

## 2015-07-14 LAB — BASIC METABOLIC PANEL
ANION GAP: 6 (ref 5–15)
BUN: 16 mg/dL (ref 6–20)
CALCIUM: 7.9 mg/dL — AB (ref 8.9–10.3)
CO2: 21 mmol/L — AB (ref 22–32)
CREATININE: 0.76 mg/dL (ref 0.61–1.24)
Chloride: 104 mmol/L (ref 101–111)
GFR calc Af Amer: >60 mL/min (ref >60)
GLUCOSE: 106 mg/dL — AB (ref 65–99)
Potassium: 3.6 mmol/L (ref 3.5–5.1)
Sodium: 131 mmol/L — ABNORMAL LOW (ref 135–145)

## 2015-07-14 LAB — MAGNESIUM: Magnesium: 1.8 mg/dL (ref 1.7–2.4)

## 2015-07-14 MED ORDER — FLUDROCORTISONE ACETATE 0.1 MG PO TABS: 0.1000 mg | ORAL_TABLET | Freq: Two times a day (BID) | ORAL | Status: DC

## 2015-07-14 MED ORDER — AMOXICILLIN-POT CLAVULANATE 875-125 MG PO TABS: 1.0000 | ORAL_TABLET | Freq: Two times a day (BID) | ORAL | Status: DC

## 2015-07-14 MED ORDER — LACTULOSE 10 GM/15ML PO SOLN
20.0000 g | Freq: Once | ORAL | Status: AC
  Administered 2015-07-14: 20 g via ORAL
  Filled 2015-07-14: qty 30

## 2015-07-14 MED ORDER — METOPROLOL TARTRATE 25 MG PO TABS: 12.5000 mg | ORAL_TABLET | Freq: Two times a day (BID) | ORAL | Status: DC

## 2015-07-14 NOTE — Care Management (Signed)
Discharge to home today per Dr. Bridgett Larsson. No follow-up needs identified. Family will transport. Shelbie Ammons RN MSN CCM Care Management [phone]

## 2015-07-14 NOTE — Discharge Summary (Signed)
[facility] [place] NAME: Derek Parker    MR#:  [id number]  DATE OF BIRTH:  11-29-1940  DATE OF ADMISSION:  07/08/2015 ADMITTING PHYSICIAN: Vaughan Basta, MD  DATE OF DISCHARGE: 07/14/2015 10:52 AM  PRIMARY CARE PHYSICIAN: Juluis Pitch, MD    ADMISSION DIAGNOSIS:  HCAP (healthcare-associated pneumonia) [J18.9]   DISCHARGE DIAGNOSIS:  sepsis, pneumonia   SECONDARY DIAGNOSIS:   Past Medical History  Diagnosis Date  . Prostate cancer ([place])     metastatic to bone  . Hypercholesteremia   . CAD (coronary artery disease)   . HTN (hypertension)   . Myocardial infarction ([place])   . GERD (gastroesophageal reflux disease)   . Neuropathy [facility])     HOSPITAL COURSE:   1. Clinical sepsis, pneumonia seen on CT scan. Patient was started on Rocephin and Zithromax. zithromax is completed. Discontinued rocephin, changed to po augmentin. 2. Sinus Tachycardia- Patient need some dose of metoprolol- decreased to 12.5 mg po bid since he was orthostatic. 3. Orthostatic hypotension. Discontinued alfuzosin. Started florinef. Blood pressure is stable. 4. Hyponatremia- since sodium worsened to 126, I started NS iv. sodium level increased to 131 today. 5. Metastatic prostate cancer to bone- seen by oncology. Follow-up as outpatient. PET scan as outpt per his oncologist. 6. History of coronary artery disease on Plavix metoprolol and statin 7. Hyperlipidemia on niacin and Crestor 8. Gastroesophageal reflux disease on Protonix 9. Elevated troponin secondary to sepsis not heart disease  DISCHARGE CONDITIONS:   Stable, discharged to home today.  CONSULTS OBTAINED:  Treatment Team:  Cammie Sickle, MD  DRUG ALLERGIES:   Allergies  Allergen Reactions  . No Known Allergies     DISCHARGE MEDICATIONS:   Discharge Medication List as of 07/14/2015  9:53 AM    START taking these medications   Details  amoxicillin-clavulanate  (AUGMENTIN) 875-125 MG tablet Take 1 tablet by mouth every 12 (twelve) hours., Starting 07/14/2015, Until Discontinued, Print    fludrocortisone (FLORINEF) 0.1 MG tablet Take 1 tablet (0.1 mg total) by mouth 2 (two) times daily., Starting 07/14/2015, Until Discontinued, Print      CONTINUE these medications which have CHANGED   Details  metoprolol tartrate (LOPRESSOR) 25 MG tablet Take 0.5 tablets (12.5 mg total) by mouth 2 (two) times daily., Starting 07/14/2015, Until Discontinued, Print      CONTINUE these medications which have NOT CHANGED   Details  Calcium Carb-Cholecalciferol (CALCIUM + D3) 600-200 MG-UNIT TABS Take 1 tablet by mouth 2 (two) times daily before a meal., Until Discontinued, Historical Med    clopidogrel (PLAVIX) 75 MG tablet Take 75 mg by mouth daily., Until Discontinued, Historical Med    Coenzyme Q10 (CO Q 10 PO) Take 1 capsule by mouth daily., Until Discontinued, Historical Med    dexlansoprazole (DEXILANT) 60 MG capsule Take 1 capsule by mouth daily., Until Discontinued, Historical Med    ferrous fumarate (HEMOCYTE - 106 MG FE) 325 (106 Fe) MG TABS tablet Take 1 tablet by mouth 2 (two) times daily., Until Discontinued, Historical Med    niacin (NIASPAN) 500 MG CR tablet Take 1,500 mg by mouth daily. 1 tablet every morning and 2 tablets at bedtime, Until Discontinued, Historical Med    oxyCODONE (OXY IR/ROXICODONE) 5 MG immediate release tablet Take 1-2 tablets (5-10 mg total) by mouth every 4 (four) hours as needed for severe pain or breakthrough pain., Starting 06/01/2015, Until Discontinued, Print    rosuvastatin (CRESTOR) 20 MG tablet Take 20  mg by mouth daily., Until Discontinued, Historical Med    solifenacin (VESICARE) 5 MG tablet Take 1 tablet by mouth daily., Until Discontinued, Historical Med    traMADol (ULTRAM) 50 MG tablet Take 1-2 tablets (50-100 mg total) by mouth every 4 (four) hours as needed for moderate pain., Starting 06/01/2015, Until  Discontinued, Normal    venlafaxine XR (EFFEXOR-XR) 75 MG 24 hr capsule Take 3 capsules by mouth daily., Until Discontinued, Historical Med      STOP taking these medications     alfuzosin (UROXATRAL) 10 MG 24 hr tablet      HYDROcodone-acetaminophen (NORCO/VICODIN) 5-325 MG tablet          DISCHARGE INSTRUCTIONS:    If you experience worsening of your admission symptoms, develop shortness of breath, life threatening emergency, suicidal or homicidal thoughts you must seek medical attention immediately by calling 911 or calling your MD immediately  if symptoms less severe.  You Must read complete instructions/literature along with all the possible adverse reactions/side effects for all the Medicines you take and that have been prescribed to you. Take any new Medicines after you have completely understood and accept all the possible adverse reactions/side effects.   Please note  You were cared for by a hospitalist during your hospital stay. If you have any questions about your discharge medications or the care you received while you were in the hospital after you are discharged, you can call the unit and asked to speak with the hospitalist on call if the hospitalist that took care of you is not available. Once you are discharged, your primary care physician will handle any further medical issues. Please note that NO REFILLS for any discharge medications will be authorized once you are discharged, as it is imperative that you return to your primary care physician (or establish a relationship with a primary care physician if you do not have one) for your aftercare needs so that they can reassess your need for medications and monitor your lab values.    Today   SUBJECTIVE   No complaint.   VITAL SIGNS:  Blood pressure 132/65, pulse 108, temperature 99 F (37.2 C), temperature source Oral, resp. rate 18, height 5\' 6"  (1.676 m), weight 94.212 kg (207 lb 11.2 oz), SpO2 97 %.  I/O:    Intake/Output Summary (Last 24 hours) at 07/14/15 1702 Last data filed at 07/14/15 0951  Gross per 24 hour  Intake    240 ml  Output    350 ml  Net   -110 ml    PHYSICAL EXAMINATION:  GENERAL:  75 y.o.-year-old patient lying in the bed with no acute distress.  EYES: Pupils equal, round, reactive to light and accommodation. No scleral icterus. Extraocular muscles intact.  HEENT: Head atraumatic, normocephalic. Oropharynx and nasopharynx clear.  NECK:  Supple, no jugular venous distention. No thyroid enlargement, no tenderness.  LUNGS: Normal breath sounds bilaterally, no wheezing, rales,rhonchi or crepitation. No use of accessory muscles of respiration.  CARDIOVASCULAR: S1, S2 normal. No murmurs, rubs, or gallops.  ABDOMEN: Soft, non-tender, non-distended. Bowel sounds present. No organomegaly or mass.  EXTREMITIES: No pedal edema, cyanosis, or clubbing.  NEUROLOGIC: Cranial nerves II through XII are intact. Muscle strength 5/5 in all extremities. Sensation intact. Gait not checked.  PSYCHIATRIC: The patient is alert and oriented x 3.  SKIN: No obvious rash, lesion, or ulcer.   DATA REVIEW:   CBC  Recent Labs Lab 07/09/15 0443 07/11/15 0423  WBC 5.6  --  HGB 9.2* 10.0*  HCT 28.2*  --   PLT 150  --     Chemistries   Recent Labs Lab 07/14/15 0417  NA 131*  K 3.6  CL 104  CO2 21*  GLUCOSE 106*  BUN 16  CREATININE 0.76  CALCIUM 7.9*  MG 1.8    Cardiac Enzymes  Recent Labs Lab 07/08/15 1652  TROPONINI 0.09*    Microbiology Results  Results for orders placed or performed during the hospital encounter of 07/08/15  Blood Culture (routine x 2)     Status: None   Collection Time: 07/08/15  7:11 PM  Result Value Ref Range Status   Specimen Description BLOOD LEFT ASSIST CONTROL  Final   Special Requests BOTTLES DRAWN AEROBIC AND ANAEROBIC 8CC  Final   Culture NO GROWTH 5 DAYS  Final   Report Status 07/13/2015 FINAL  Final  Blood Culture (routine x 2)      Status: None   Collection Time: 07/08/15  7:11 PM  Result Value Ref Range Status   Specimen Description BLOOD RIGHT ASSIST CONTROL  Final   Special Requests   Final    BOTTLES DRAWN AEROBIC AND ANAEROBIC [place] AERO 5CC ANA   Culture NO GROWTH 5 DAYS  Final   Report Status 07/13/2015 FINAL  Final  Urine culture     Status: None   Collection Time: 07/09/15  7:24 AM  Result Value Ref Range Status   Specimen Description URINE, RANDOM  Final   Special Requests NONE  Final   Culture NO GROWTH 2 DAYS  Final   Report Status 07/11/2015 FINAL  Final    RADIOLOGY:  No results found.      Management plans discussed with the patient, family and they are in agreement.  CODE STATUS:  Code Status History    Date Active Date Inactive Code Status Order ID Comments User Context   07/08/2015  7:43 PM 07/14/2015  2:00 PM DNR [id number]  Vaughan Basta, MD ED   07/08/2015  7:42 PM 07/08/2015  7:43 PM Full Code [id number]  Vaughan Basta, MD ED   05/31/2015  4:55 PM 06/02/2015  2:55 PM DNR [id number]  Haynes Dage, RN Inpatient   05/31/2015  1:02 PM 05/31/2015  4:55 PM Full Code [id number]  [name], MD Inpatient    Questions for Most Recent Historical Code Status (Order [id number])    Question Answer Comment   In the event of cardiac or respiratory ARREST Do not call a "code blue"    In the event of cardiac or respiratory ARREST Do not perform Intubation, CPR, defibrillation or ACLS    In the event of cardiac or respiratory ARREST Use medication by any route, position, wound care, and other measures to relive pain and suffering. May use oxygen, suction and manual treatment of airway obstruction as needed for comfort.     Advance Directive Documentation        Most Recent Value   Type of Advance Directive  Healthcare Power of Attorney, Living will   Pre-existing out of facility DNR order (yellow form or pink MOST form)     "MOST" Form in Place?        TOTAL TIME TAKING CARE OF THIS  PATIENT: 35 minutes.    Demetrios Loll M.D on 07/14/2015 at 5:02 PM  Between 7am to 6pm - Pager - [phone]  After 6pm go to [email or web address] - password [email or web address] [facility]  [facility]  Office  [phone]  CC: Primary  care physician; Juluis Pitch, MD

## 2015-07-14 NOTE — Discharge Instructions (Signed)
Heart healthy diet. Activity as tolerated. outpatient PT.

## 2015-07-14 NOTE — Plan of Care (Signed)
Problem: Safety: Goal: Ability to remain free from injury will improve Outcome: Progressing Pt remains free from injury.  Fall precautions in place.

## 2015-07-14 NOTE — Care Management Important Message (Signed)
Important Message  Patient Details  Name: Derek Parker MRN: [id number] Date of Birth: [birth year]   Medicare Important Message Given:  Yes    Juliann Pulse A Aashrith Eves 07/14/2015, 10:03 AM

## 2015-07-14 NOTE — Progress Notes (Signed)
Discussed discharge medications and instructions with pt and wife.  IV's removed per policy.  Pt transported home via car by wife.  Clarise Cruz, RN

## 2015-07-21 ENCOUNTER — Ambulatory Visit
Admission: RE | Admit: 2015-07-21 | Discharge: 2015-07-21 | Disposition: A | Discharge status: Home / Self Care | Payer: Medicare Other | Source: Ambulatory Visit | Attending: Internal Medicine | Admitting: Internal Medicine

## 2015-07-21 DIAGNOSIS — C61 Malignant neoplasm of prostate: Secondary | ICD-10-CM | POA: Insufficient documentation

## 2015-07-21 DIAGNOSIS — C7951 Secondary malignant neoplasm of bone: Secondary | ICD-10-CM | POA: Diagnosis not present

## 2015-07-21 DIAGNOSIS — R9721 Rising PSA following treatment for malignant neoplasm of prostate: Secondary | ICD-10-CM | POA: Diagnosis present

## 2015-07-21 DIAGNOSIS — R590 Localized enlarged lymph nodes: Secondary | ICD-10-CM | POA: Insufficient documentation

## 2015-08-06 ENCOUNTER — Encounter: Payer: Self-pay | Admitting: Emergency Medicine

## 2015-08-06 ENCOUNTER — Inpatient Hospital Stay
Admission: EM | Admit: 2015-08-06 | Discharge: 2015-08-08 | DRG: 641 | Disposition: A | Discharge status: Home / Self Care | Payer: Medicare Other | Attending: Internal Medicine | Admitting: Internal Medicine

## 2015-08-06 DIAGNOSIS — Z9049 Acquired absence of other specified parts of digestive tract: Secondary | ICD-10-CM | POA: Diagnosis not present

## 2015-08-06 DIAGNOSIS — Z9221 Personal history of antineoplastic chemotherapy: Secondary | ICD-10-CM

## 2015-08-06 DIAGNOSIS — E860 Dehydration: Principal | ICD-10-CM | POA: Diagnosis present

## 2015-08-06 DIAGNOSIS — C7951 Secondary malignant neoplasm of bone: Secondary | ICD-10-CM | POA: Diagnosis present

## 2015-08-06 DIAGNOSIS — C61 Malignant neoplasm of prostate: Secondary | ICD-10-CM | POA: Diagnosis present

## 2015-08-06 DIAGNOSIS — Z951 Presence of aortocoronary bypass graft: Secondary | ICD-10-CM

## 2015-08-06 DIAGNOSIS — Z87891 Personal history of nicotine dependence: Secondary | ICD-10-CM | POA: Diagnosis not present

## 2015-08-06 DIAGNOSIS — F329 Major depressive disorder, single episode, unspecified: Secondary | ICD-10-CM | POA: Diagnosis present

## 2015-08-06 DIAGNOSIS — I252 Old myocardial infarction: Secondary | ICD-10-CM | POA: Diagnosis not present

## 2015-08-06 DIAGNOSIS — I10 Essential (primary) hypertension: Secondary | ICD-10-CM | POA: Diagnosis present

## 2015-08-06 DIAGNOSIS — R778 Other specified abnormalities of plasma proteins: Secondary | ICD-10-CM

## 2015-08-06 DIAGNOSIS — E785 Hyperlipidemia, unspecified: Secondary | ICD-10-CM | POA: Diagnosis present

## 2015-08-06 DIAGNOSIS — N179 Acute kidney failure, unspecified: Secondary | ICD-10-CM | POA: Diagnosis present

## 2015-08-06 DIAGNOSIS — R630 Anorexia: Secondary | ICD-10-CM | POA: Diagnosis present

## 2015-08-06 DIAGNOSIS — K219 Gastro-esophageal reflux disease without esophagitis: Secondary | ICD-10-CM | POA: Diagnosis present

## 2015-08-06 DIAGNOSIS — R000 Tachycardia, unspecified: Secondary | ICD-10-CM | POA: Diagnosis present

## 2015-08-06 DIAGNOSIS — I248 Other forms of acute ischemic heart disease: Secondary | ICD-10-CM | POA: Diagnosis present

## 2015-08-06 DIAGNOSIS — Z9889 Other specified postprocedural states: Secondary | ICD-10-CM

## 2015-08-06 DIAGNOSIS — Z9849 Cataract extraction status, unspecified eye: Secondary | ICD-10-CM | POA: Diagnosis not present

## 2015-08-06 DIAGNOSIS — I2510 Atherosclerotic heart disease of native coronary artery without angina pectoris: Secondary | ICD-10-CM | POA: Diagnosis present

## 2015-08-06 DIAGNOSIS — Z809 Family history of malignant neoplasm, unspecified: Secondary | ICD-10-CM

## 2015-08-06 DIAGNOSIS — B359 Dermatophytosis, unspecified: Secondary | ICD-10-CM | POA: Diagnosis present

## 2015-08-06 DIAGNOSIS — R7989 Other specified abnormal findings of blood chemistry: Secondary | ICD-10-CM

## 2015-08-06 DIAGNOSIS — Z96652 Presence of left artificial knee joint: Secondary | ICD-10-CM | POA: Diagnosis present

## 2015-08-06 LAB — BASIC METABOLIC PANEL
Anion gap: 13 (ref 5–15)
BUN: 28 mg/dL — AB (ref 6–20)
CO2: 21 mmol/L — AB (ref 22–32)
Calcium: 9.7 mg/dL (ref 8.9–10.3)
Chloride: 102 mmol/L (ref 101–111)
Creatinine, Ser: 1.39 mg/dL — ABNORMAL HIGH (ref 0.61–1.24)
GFR calc Af Amer: 56 mL/min — ABNORMAL LOW (ref >60)
GFR, EST NON AFRICAN AMERICAN: 49 mL/min — AB (ref >60)
GLUCOSE: 206 mg/dL — AB (ref 65–99)
POTASSIUM: 5 mmol/L (ref 3.5–5.1)
Sodium: 136 mmol/L (ref 135–145)

## 2015-08-06 LAB — URINALYSIS COMPLETE WITH MICROSCOPIC (ARMC ONLY)
Bilirubin Urine: NEGATIVE
GLUCOSE, UA: NEGATIVE mg/dL
Hgb urine dipstick: NEGATIVE
Ketones, ur: NEGATIVE mg/dL
Leukocytes, UA: NEGATIVE
NITRITE: NEGATIVE
PROTEIN: 30 mg/dL — AB
RBC / HPF: NONE SEEN RBC/hpf (ref 0–5)
SPECIFIC GRAVITY, URINE: 1.017 (ref 1.005–1.030)
pH: 7 (ref 5.0–8.0)

## 2015-08-06 LAB — CBC
HEMATOCRIT: 30.9 % — AB (ref 40.0–52.0)
Hemoglobin: 10 g/dL — ABNORMAL LOW (ref 13.0–18.0)
MCH: 26 pg (ref 26.0–34.0)
MCHC: 32.3 g/dL (ref 32.0–36.0)
MCV: 80.6 fL (ref 80.0–100.0)
Platelets: 165 10*3/uL (ref 150–440)
RBC: 3.83 MIL/uL — ABNORMAL LOW (ref 4.40–5.90)
RDW: 20.9 % — AB (ref 11.5–14.5)
WBC: 9.9 10*3/uL (ref 3.8–10.6)

## 2015-08-06 LAB — TSH: TSH: 2.484 u[IU]/mL (ref 0.350–4.500)

## 2015-08-06 LAB — TROPONIN I
TROPONIN I: 0.11 ng/mL — AB (ref <0.031)
Troponin I: 0.06 ng/mL — ABNORMAL HIGH (ref <0.031)

## 2015-08-06 MED ORDER — TRAMADOL HCL 50 MG PO TABS: 50.0000 mg | ORAL_TABLET | ORAL | Status: DC | PRN

## 2015-08-06 MED ORDER — FERROUS SULFATE 325 (65 FE) MG PO TABS: 325.0000 mg | ORAL_TABLET | Freq: Every day | ORAL | Status: DC

## 2015-08-06 MED ORDER — METOPROLOL TARTRATE 25 MG PO TABS
25.0000 mg | ORAL_TABLET | Freq: Two times a day (BID) | ORAL | Status: DC
  Administered 2015-08-06: 25 mg via ORAL
  Filled 2015-08-06: qty 1

## 2015-08-06 MED ORDER — ACETAMINOPHEN 650 MG RE SUPP: 650.0000 mg | Freq: Four times a day (QID) | RECTAL | Status: DC | PRN

## 2015-08-06 MED ORDER — NIACIN ER 500 MG PO CPCR
1000.0000 mg | ORAL_CAPSULE | Freq: Every evening | ORAL | Status: DC
  Administered 2015-08-06 – 2015-08-07 (×2): 1000 mg via ORAL
  Filled 2015-08-06 (×2): qty 2

## 2015-08-06 MED ORDER — OXYCODONE HCL 5 MG PO TABS: 5.0000 mg | ORAL_TABLET | ORAL | Status: DC | PRN

## 2015-08-06 MED ORDER — ENOXAPARIN SODIUM 40 MG/0.4ML ~~LOC~~ SOLN
40.0000 mg | SUBCUTANEOUS | Status: DC
  Administered 2015-08-06: 40 mg via SUBCUTANEOUS
  Filled 2015-08-06: qty 0.4

## 2015-08-06 MED ORDER — SODIUM CHLORIDE 0.9% FLUSH
3.0000 mL | Freq: Two times a day (BID) | INTRAVENOUS | Status: DC
  Administered 2015-08-06 – 2015-08-07 (×3): 3 mL via INTRAVENOUS

## 2015-08-06 MED ORDER — CLOPIDOGREL BISULFATE 75 MG PO TABS
75.0000 mg | ORAL_TABLET | Freq: Every day | ORAL | Status: DC
  Administered 2015-08-07 – 2015-08-08 (×2): 75 mg via ORAL
  Filled 2015-08-06 (×2): qty 1

## 2015-08-06 MED ORDER — ROSUVASTATIN CALCIUM 20 MG PO TABS
20.0000 mg | ORAL_TABLET | Freq: Every day | ORAL | Status: DC
  Administered 2015-08-07 – 2015-08-08 (×2): 20 mg via ORAL
  Filled 2015-08-06 (×2): qty 1

## 2015-08-06 MED ORDER — CO Q 10 10 MG PO CAPS: ORAL_CAPSULE | Freq: Every day | ORAL | Status: DC

## 2015-08-06 MED ORDER — PANTOPRAZOLE SODIUM 40 MG PO TBEC
40.0000 mg | DELAYED_RELEASE_TABLET | Freq: Every day | ORAL | Status: DC
  Administered 2015-08-07 – 2015-08-08 (×2): 40 mg via ORAL
  Filled 2015-08-06 (×2): qty 1

## 2015-08-06 MED ORDER — ONDANSETRON HCL 4 MG PO TABS: 4.0000 mg | ORAL_TABLET | Freq: Four times a day (QID) | ORAL | Status: DC | PRN

## 2015-08-06 MED ORDER — SODIUM CHLORIDE 0.9 % IV BOLUS (SEPSIS)
1000.0000 mL | Freq: Once | INTRAVENOUS | Status: AC
  Administered 2015-08-06: 1000 mL via INTRAVENOUS

## 2015-08-06 MED ORDER — METOPROLOL TARTRATE 50 MG PO TABS
50.0000 mg | ORAL_TABLET | Freq: Once | ORAL | Status: AC
  Administered 2015-08-06: 50 mg via ORAL
  Filled 2015-08-06: qty 1

## 2015-08-06 MED ORDER — NAPROXEN 250 MG PO TABS
250.0000 mg | ORAL_TABLET | Freq: Two times a day (BID) | ORAL | Status: DC | PRN
  Filled 2015-08-06: qty 1

## 2015-08-06 MED ORDER — ACETAMINOPHEN 325 MG PO TABS: 650.0000 mg | ORAL_TABLET | Freq: Four times a day (QID) | ORAL | Status: DC | PRN

## 2015-08-06 MED ORDER — CALCIUM CARBONATE-VITAMIN D 500-200 MG-UNIT PO TABS
1.0000 | ORAL_TABLET | Freq: Two times a day (BID) | ORAL | Status: DC
  Administered 2015-08-06 – 2015-08-08 (×4): 1 via ORAL
  Filled 2015-08-06 (×4): qty 1

## 2015-08-06 MED ORDER — ONDANSETRON HCL 4 MG/2ML IJ SOLN: 4.0000 mg | Freq: Four times a day (QID) | INTRAMUSCULAR | Status: DC | PRN

## 2015-08-06 MED ORDER — VENLAFAXINE HCL ER 75 MG PO CP24
225.0000 mg | ORAL_CAPSULE | Freq: Every day | ORAL | Status: DC
  Administered 2015-08-07 – 2015-08-08 (×2): 225 mg via ORAL
  Filled 2015-08-06 (×3): qty 1

## 2015-08-06 MED ORDER — ADULT MULTIVITAMIN W/MINERALS CH
0.5000 | ORAL_TABLET | Freq: Two times a day (BID) | ORAL | Status: DC
Start: 2015-08-06 — End: 2015-08-08
  Administered 2015-08-06 – 2015-08-08 (×4): 0.5 via ORAL
  Filled 2015-08-06 (×4): qty 1

## 2015-08-06 MED ORDER — DARIFENACIN HYDROBROMIDE ER 7.5 MG PO TB24
7.5000 mg | ORAL_TABLET | Freq: Every day | ORAL | Status: DC
  Administered 2015-08-07 – 2015-08-08 (×2): 7.5 mg via ORAL
  Filled 2015-08-06 (×2): qty 1

## 2015-08-06 MED ORDER — NIACIN ER (ANTIHYPERLIPIDEMIC) 500 MG PO TBCR
500.0000 mg | EXTENDED_RELEASE_TABLET | Freq: Every morning | ORAL | Status: DC
  Administered 2015-08-07 – 2015-08-08 (×2): 500 mg via ORAL
  Filled 2015-08-06 (×4): qty 1

## 2015-08-06 MED ORDER — FERROUS SULFATE 325 (65 FE) MG PO TABS
325.0000 mg | ORAL_TABLET | Freq: Two times a day (BID) | ORAL | Status: DC
  Administered 2015-08-07 – 2015-08-08 (×3): 325 mg via ORAL
  Filled 2015-08-06 (×3): qty 1

## 2015-08-06 MED ORDER — MEGESTROL ACETATE 400 MG/10ML PO SUSP
800.0000 mg | Freq: Every day | ORAL | Status: DC
  Administered 2015-08-06 – 2015-08-08 (×3): 800 mg via ORAL
  Filled 2015-08-06 (×3): qty 20

## 2015-08-06 MED ORDER — SODIUM CHLORIDE 0.9 % IV SOLN
INTRAVENOUS | Status: DC
  Administered 2015-08-06 – 2015-08-07 (×2): via INTRAVENOUS

## 2015-08-06 MED ORDER — POTASSIUM CHLORIDE CRYS ER 20 MEQ PO TBCR
20.0000 meq | EXTENDED_RELEASE_TABLET | Freq: Two times a day (BID) | ORAL | Status: DC
  Administered 2015-08-06 – 2015-08-08 (×4): 20 meq via ORAL
  Filled 2015-08-06 (×4): qty 1

## 2015-08-06 MED ORDER — FLUDROCORTISONE ACETATE 0.1 MG PO TABS
0.1000 mg | ORAL_TABLET | Freq: Every day | ORAL | Status: DC
  Administered 2015-08-07 – 2015-08-08 (×2): 0.1 mg via ORAL
  Filled 2015-08-06 (×2): qty 1

## 2015-08-06 NOTE — ED Provider Notes (Signed)
[facility] Emergency Department Provider Note   ____________________________________________  Time seen: Approximately 3 PM I have reviewed the triage vital signs and the triage nursing note.  HISTORY  Chief Complaint Weakness and Tachycardia   Historian Patient  HPI Derek Parker is a 75 y.o. male who receives chemo, within the last week, here for generalized weakness.  Stopped to see urgent care, found to have elevated hr and referred to ED.  No n/v/d or fever. No cp, sob or palpitations.  No syncope.  Has been in hospital in January for pneumonia and orthostatic hypotension.  Metoprolol was halved per the family.    Past Medical History  Diagnosis Date  . 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 Hypercholesteremia   . CAD (coronary artery disease)   . HTN (hypertension)   . Myocardial infarction ([place])   . GERD (gastroesophageal reflux disease)   . Neuropathy [facility])     Patient Active Problem List   Diagnosis Date Noted  . Protein-calorie malnutrition, severe 07/09/2015  . Prostate cancer metastatic to bone ([place])   . Pneumonia 07/08/2015  . Sinus tachycardia ([place]) 07/08/2015  . 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 Coronary artery bypass graft    . Umbilical hernia repair    . Cholecystectomy    . Knee surgery Left   . Carotid stent    . Knee surgery Left 2006  . Hernia repair    . Cataract extraction    . Knee arthroplasty Left 05/31/2015    Procedure: COMPUTER ASSISTED TOTAL KNEE ARTHROPLASTY;  Surgeon: [name], MD;  Location: [facility] ORS;  Service: Orthopedics;  Laterality: Left;    Current Outpatient Rx  Name  Route  Sig  Dispense  Refill  . amoxicillin-clavulanate (AUGMENTIN) 875-125 MG tablet   Oral   Take 1 tablet by mouth every 12 (twelve) hours.   10 tablet   0   . Calcium Carb-Cholecalciferol (CALCIUM + D3) 600-200 MG-UNIT TABS   Oral   Take 1  tablet by mouth 2 (two) times daily before a meal.         . clopidogrel (PLAVIX) 75 MG tablet   Oral   Take 75 mg by mouth daily.         . Coenzyme Q10 (CO Q 10 PO)   Oral   Take 1 capsule by mouth daily.         Marland Kitchen dexlansoprazole (DEXILANT) 60 MG capsule   Oral   Take 1 capsule by mouth daily.         . ferrous fumarate (HEMOCYTE - 106 MG FE) 325 (106 Fe) MG TABS tablet   Oral   Take 1 tablet by mouth 2 (two) times daily.         . fludrocortisone (FLORINEF) 0.1 MG tablet   Oral   Take 1 tablet (0.1 mg total) by mouth 2 (two) times daily.   30 tablet   0   . metoprolol tartrate (LOPRESSOR) 25 MG tablet   Oral   Take 0.5 tablets (12.5 mg total) by mouth 2 (two) times daily.   30 tablet   0   . niacin (NIASPAN) 500 MG CR tablet   Oral   Take 1,500 mg by mouth daily. 1 tablet every morning and 2 tablets at bedtime         . oxyCODONE (OXY IR/ROXICODONE) 5 MG immediate release  tablet   Oral   Take 1-2 tablets (5-10 mg total) by mouth every 4 (four) hours as needed for severe pain or breakthrough pain.   80 tablet   0   . rosuvastatin (CRESTOR) 20 MG tablet   Oral   Take 20 mg by mouth daily.         . solifenacin (VESICARE) 5 MG tablet   Oral   Take 1 tablet by mouth daily.         . traMADol (ULTRAM) 50 MG tablet   Oral   Take 1-2 tablets (50-100 mg total) by mouth every 4 (four) hours as needed for moderate pain.   60 tablet   1   . venlafaxine XR (EFFEXOR-XR) 75 MG 24 hr capsule   Oral   Take 3 capsules by mouth daily.           Allergies No known allergies  Family History  Problem Relation Age of Onset  . Cancer Father     prostate    Social History Social History  Substance Use Topics  . Smoking status: Former Smoker -- 40 years  . Smokeless tobacco: None  . Alcohol Use: 0.6 oz/week    1 Shots of liquor per week    Review of Systems  Constitutional: Negative for fever. Eyes: Negative for visual changes. ENT:  Negative for sore throat. Cardiovascular: Negative for chest pain. Respiratory: Negative for shortness of breath. Negative for cough. Gastrointestinal: Negative for abdominal pain, vomiting and diarrhea. Genitourinary: Negative for dysuria. Musculoskeletal: Negative for back pain. Skin: Negative for rash. Neurological: Negative for headache. 10 point Review of Systems otherwise negative ____________________________________________   PHYSICAL EXAM:  VITAL SIGNS: ED Triage Vitals  Enc Vitals Group     BP 08/06/15 1218 140/113 mmHg     Pulse Rate 08/06/15 1218 128     Resp 08/06/15 1218 16     Temp 08/06/15 1455 97.5 F (36.4 C)     Temp Source 08/06/15 1455 Oral     SpO2 08/06/15 1218 97 %     Weight 08/06/15 1218 198 lb (89.812 kg)     Height 08/06/15 1218 6' (1.829 m)     Head Cir --      Peak Flow --      Pain Score --      Pain Loc --      Pain Edu? --      Excl. in [place]? --      Constitutional: Alert and oriented. Well appearing and in no distress. HEENT   Head: Normocephalic and atraumatic.      Eyes: Conjunctivae are normal. PERRL. Normal extraocular movements.      Ears:         Nose: No congestion/rhinnorhea.   Mouth/Throat: Mucous membranes are moist.   Neck: No stridor. Cardiovascular/Chest: Tachycardic, regular.  No murmurs, rubs, or gallops. Respiratory: Normal respiratory effort without tachypnea nor retractions. Breath sounds are clear and equal bilaterally. No wheezes/rales/rhonchi. Gastrointestinal: Soft. No distention, no guarding, no rebound. Nontender.    Genitourinary/rectal:Deferred Musculoskeletal: Nontender with normal range of motion in all extremities. No joint effusions.  No lower extremity tenderness.  No edema. Neurologic:  Normal speech and language. No gross or focal neurologic deficits are appreciated. Skin:  Skin is warm, dry and intact. No rash noted. Psychiatric: Mood and affect are normal. Speech and behavior are normal.  Patient exhibits appropriate insight and judgment.  ____________________________________________   EKG I, Lisa Roca, MD, the attending physician  have personally viewed and interpreted all ECGs.  130bpm. Sinus tachycardia.  Nonspecific iv conduction delay.  T wave inversion laterally. ____________________________________________  LABS (pertinent positives/negatives)   basic metabolic panel significant for BUN 28 and creatinine1.39 White blood count 9.9, hemoglobin 10.0 and platelet count 165 Urinalysis rare bacteria and otherwise negative Troponin  0.11  ____________________________________________  RADIOLOGY All Xrays were viewed by me. Imaging interpreted by Radiologist.  none __________________________________________  PROCEDURES  Procedure(s) performed: None  Critical Care performed: None  ____________________________________________   ED COURSE / ASSESSMENT AND PLAN  Pertinent labs & imaging results that were available during my care of the patient were reviewed by me and considered in my medical decision making (see chart for details).    Complaint is generalized weakness and found to have tachycardia.  Labs show acute renal failure consistent with dehydration.  Pt given ns bolus in ED.  HR improved initially but then back to 110 at rest. Second liter initiated.  No hypotension with standing.  No focal neurologic complaints or findings on exam.     Patient's troponin came back elevated 0.11. When I look previously, he has had negative troponins in the past. Most recently was hospitalized from 0.09 and it does not appear that was repeated but was felt to be due to sepsis at that point time.  This may be due to demand ischemia given the tachycardia, however he does have a history of coronary artery disease with bypass and 7 stents, and so I discussed with the patient and he would like to go ahead and have all full cardiac evaluation and rule out.  Given IV  fluids, and the persistent tachycardia, I do suspect some of this made the intrinsic. Family states that prior to hospitalization he was on 25 mg metoprolol in the morning and 50 mg metoprolol in the evening. After his hospital stay he was decreased to 12.5 mg morning and night. Family states that initially they were checking his heart rate at home, but the last several days they have been it's been 120s to 130s even at rest in the morning.  Perhaps his tachycardia is due to the decreased in the metoprolol dosing. I did go ahead and give him a dose of 50 mg right now by mouth.  His cardiologist is Dr. Saralyn Pilar.   CONSULTATIONS:   Face-to-face with hospitalist for admission.   Patient / Family / Caregiver informed of clinical course, medical decision-making process, and agree with plan.  .   ___________________________________________   FINAL CLINICAL IMPRESSION(S) / ED DIAGNOSES   Final diagnoses:  Dehydration  Acute renal failure, unspecified acute renal failure type ([place])  Elevated troponin              Note: This dictation was prepared with Dragon dictation. Any transcriptional errors that result from this process are unintentional   Lisa Roca, MD 08/06/15 1750

## 2015-08-06 NOTE — ED Notes (Signed)
Increased heart rate.  Weakness.  Has been going to cancer center and has been monitoring his pulse.  Was worse today and kcac wouldnot see due to increased heart rate.

## 2015-08-06 NOTE — H&P (Signed)
[place] [facility] [place] NAME: Derek Parker    MR#:  [id number]  DATE OF BIRTH:  08/16/1940  DATE OF ADMISSION:  08/06/2015  PRIMARY CARE PHYSICIAN: Juluis Pitch, MD   REQUESTING/REFERRING PHYSICIAN: Lisa Roca  CHIEF COMPLAINT:   Chief Complaint  Patient presents with  . Weakness  . Tachycardia    HISTORY OF PRESENT ILLNESS: Derek Parker  is a 75 y.o. male with a known history of prostate cancer stage IV who received his first came on Monday, hyper lipidemia, coronary artery disease, hypertension, GERD who presents with generalized weakness. patient went to see his primary care provider but they referred him to urgent care where they noticed his heart rate to be very elevated therefore he was sent to the ER. In the ER he is noted to have elevated heart rate and appears dehydrated. He denies any chest pain or shortness of breath he's reports that his feeling weak for the past 2 months but his symptoms have gotten worse. He also reports decrease in oral intake and lack of appetite.   PAST MEDICAL HISTORY:   Past Medical History  Diagnosis Date  . 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 Hypercholesteremia   . CAD (coronary artery disease)   . HTN (hypertension)   . Myocardial infarction ([place])   . GERD (gastroesophageal reflux disease)   . Neuropathy ([place])     PAST SURGICAL HISTORY: Past Surgical History  Procedure Laterality Date  . Coronary artery bypass graft    . Umbilical hernia repair    . Cholecystectomy    . Knee surgery Left   . Carotid stent    . Knee surgery Left 2006  . Hernia repair    . Cataract extraction    . Knee arthroplasty Left 05/31/2015    Procedure: COMPUTER ASSISTED TOTAL KNEE ARTHROPLASTY;  Surgeon: [name], MD;  Location: [facility] ORS;  Service: Orthopedics;  Laterality: Left;    SOCIAL HISTORY:  Social History  Substance Use Topics  . Smoking status: Former Smoker -- 40 years  .  Smokeless tobacco: Not on file  . Alcohol Use: 0.6 oz/week    1 Shots of liquor per week    FAMILY HISTORY:  Family History  Problem Relation Age of Onset  . Cancer Father     prostate    DRUG ALLERGIES: No Known Allergies  REVIEW OF SYSTEMS:   CONSTITUTIONAL: No fever,positive  fatigue and  weakness.  EYES: No blurred or double vision.  EARS, NOSE, AND THROAT: No tinnitus or ear pain.  RESPIRATORY: No cough, shortness of breath, wheezing or hemoptysis.  CARDIOVASCULAR: No chest pain, orthopnea, edema.  GASTROINTESTINAL: No nausea, vomiting, diarrhea or abdominal pain.  lack of appetite  GENITOURINARY: No dysuria, hematuria.  ENDOCRINE: No polyuria, nocturia,  HEMATOLOGY: No anemia, easy bruising or bleeding SKIN: No rash or lesion. MUSCULOSKELETAL: No joint pain or arthritis.   NEUROLOGIC: No tingling, numbness, weakness.  PSYCHIATRY: No anxiety or depression.   MEDICATIONS AT HOME:  Prior to Admission medications   Medication Sig Start Date End Date Taking? Authorizing Provider  Calcium Carb-Cholecalciferol (CALCIUM 600+D) 600-800 MG-UNIT TABS Take 1 tablet by mouth 2 (two) times daily.   Yes Historical Provider, MD  clopidogrel (PLAVIX) 75 MG tablet Take 75 mg by mouth daily.   Yes Historical Provider, MD  Coenzyme Q10 (CO Q 10 PO) Take 1 capsule by mouth daily.   Yes Historical Provider,  MD  dexlansoprazole (DEXILANT) 60 MG capsule Take 60 mg by mouth daily.    Yes Historical Provider, MD  ferrous sulfate 325 (65 FE) MG tablet Take 325 mg by mouth daily with breakfast.   Yes Historical Provider, MD  fludrocortisone (FLORINEF) 0.1 MG tablet Take 0.1 mg by mouth daily.   Yes Historical Provider, MD  metoprolol tartrate (LOPRESSOR) 25 MG tablet Take 0.5 tablets (12.5 mg total) by mouth 2 (two) times daily. 07/14/15  Yes Demetrios Loll, MD  Multiple Vitamin (MULTIVITAMIN WITH MINERALS) TABS tablet Take 0.5 tablets by mouth 2 (two) times daily.   Yes Historical Provider, MD   naproxen sodium (ANAPROX) 220 MG tablet Take 220 mg by mouth 2 (two) times daily as needed (for pain).   Yes Historical Provider, MD  niacin (NIASPAN) 500 MG CR tablet Take 500-1,000 mg by mouth 2 (two) times daily. Pt takes one tablet in the morning and two in the evening.   Yes Historical Provider, MD  oxyCODONE (OXY IR/ROXICODONE) 5 MG immediate release tablet Take 1-2 tablets (5-10 mg total) by mouth every 4 (four) hours as needed for severe pain or breakthrough pain. 06/01/15  Yes Watt Climes, PA  potassium chloride SA (K-DUR,KLOR-CON) 20 MEQ tablet Take 20 mEq by mouth 2 (two) times daily.   Yes Historical Provider, MD  rosuvastatin (CRESTOR) 20 MG tablet Take 20 mg by mouth daily.   Yes Historical Provider, MD  solifenacin (VESICARE) 5 MG tablet Take 5 mg by mouth daily.    Yes Historical Provider, MD  traMADol (ULTRAM) 50 MG tablet Take 1-2 tablets (50-100 mg total) by mouth every 4 (four) hours as needed for moderate pain. 06/01/15  Yes Watt Climes, PA  venlafaxine XR (EFFEXOR-XR) 75 MG 24 hr capsule Take 225 mg by mouth daily.    Yes Historical Provider, MD  amoxicillin-clavulanate (AUGMENTIN) 875-125 MG tablet Take 1 tablet by mouth every 12 (twelve) hours. Patient not taking: Reported on 08/06/2015 07/14/15   Demetrios Loll, MD      PHYSICAL EXAMINATION:   VITAL SIGNS: Blood pressure 129/68, pulse 105, temperature 97.5 F (36.4 C), temperature source Oral, resp. rate 19, height 6' (1.829 m), weight 89.812 kg (198 lb), SpO2 100 %.  GENERAL:  75 y.o.-year-old patient lying in the bed with no acute distress.  EYES: Pupils equal, round, reactive to light and accommodation. No scleral icterus. Extraocular muscles intact.  HEENT: Head atraumatic, normocephalic. Oropharynx and nasopharynx clear.  NECK:  Supple, no jugular venous distention. No thyroid enlargement, no tenderness.  LUNGS: Normal breath sounds bilaterally, no wheezing, rales,rhonchi or crepitation. No use of accessory muscles of  respiration.  CARDIOVASCULAR: S1, S2 normal. No murmurs, rubs, or gallops.  ABDOMEN: Soft, nontender, nondistended. Bowel sounds present. No organomegaly or mass.  EXTREMITIES: No pedal edema, cyanosis, or clubbing.  NEUROLOGIC: Cranial nerves II through XII are intact. Muscle strength 5/5 in all extremities. Sensation intact. Gait not checked.  PSYCHIATRIC: The patient is alert and oriented x 3.  SKIN: No obvious rash, lesion, or ulcer.   LABORATORY PANEL:   CBC  Recent Labs Lab 08/06/15 1218  WBC 9.9  HGB 10.0*  HCT 30.9*  PLT 165  MCV 80.6  MCH 26.0  MCHC 32.3  RDW 20.9*   ------------------------------------------------------------------------------------------------------------------  Chemistries   Recent Labs Lab 08/06/15 1218  NA 136  K 5.0  CL 102  CO2 21*  GLUCOSE 206*  BUN 28*  CREATININE 1.39*  CALCIUM 9.7   ------------------------------------------------------------------------------------------------------------------ estimated creatinine  clearance is 52 mL/min (by C-G formula based on Cr of 1.39). ------------------------------------------------------------------------------------------------------------------ No results for input(s): TSH, T4TOTAL, T3FREE, THYROIDAB in the last 72 hours.  Invalid input(s): FREET3   Coagulation profile No results for input(s): INR, PROTIME in the last 168 hours. ------------------------------------------------------------------------------------------------------------------- No results for input(s): DDIMER in the last 72 hours. -------------------------------------------------------------------------------------------------------------------  Cardiac Enzymes  Recent Labs Lab 08/06/15 1218  TROPONINI 0.11*   ------------------------------------------------------------------------------------------------------------------ Invalid input(s):  POCBNP  ---------------------------------------------------------------------------------------------------------------  Urinalysis    Component Value Date/Time   COLORURINE YELLOW* 08/06/2015 1218   APPEARANCEUR CLEAR* 08/06/2015 1218   LABSPEC 1.017 08/06/2015 1218   PHURINE 7.0 08/06/2015 [place] 08/06/2015 [place] NEGATIVE 08/06/2015 [place] 08/06/2015 [place] 08/06/2015 1218   PROTEINUR 30* 08/06/2015 1218   NITRITE NEGATIVE 08/06/2015 1218   LEUKOCYTESUR NEGATIVE 08/06/2015 1218     RADIOLOGY: No results found.  EKG: Orders placed or performed during the hospital encounter of 08/06/15  . EKG 12-Lead  . EKG 12-Lead  . ED EKG  . ED EKG    IMPRESSION AND PLAN:  patient is a 75 year old white male with history of prostate cancer presents with generalized weakness tachycardia   1. Sinus tachycardia likely due to dehydration his hemoglobin was recently 8 now at 10, also his creatinine is increase suggestive of dehydration. At this time will give him IV fluids. Monitor his heart rate on telemetry I will increase his metoprolol dose  2. Generalized weakness will obtain PT evaluation check a vitamin D and B12 level  3. Poor appetite start him on Megace  4. Acute renal failure due to dehydration IV fluids  5. Coronary artery disease with elevated troponin likely due to demand ischemia I will follow his troponin level continues current therapy  6. depression continue Effexor      All the records are reviewed and case discussed with ED provider. Management plans discussed with the patient, family and they are in agreement.  CODE STATUS: Code Status History    Date Active Date Inactive Code Status Order ID Comments User Context   07/08/2015  7:43 PM 07/14/2015  2:00 PM DNR [id number]  Vaughan Basta, MD ED   07/08/2015  7:42 PM 07/08/2015  7:43 PM Full Code [id number]  Vaughan Basta, MD ED    05/31/2015  4:55 PM 06/02/2015  2:55 PM DNR [id number]  Haynes Dage, RN Inpatient   05/31/2015  1:02 PM 05/31/2015  4:55 PM Full Code [id number]  [name], MD Inpatient    Questions for Most Recent Historical Code Status (Order [id number])    Question Answer Comment   In the event of cardiac or respiratory ARREST Do not call a "code blue"    In the event of cardiac or respiratory ARREST Do not perform Intubation, CPR, defibrillation or ACLS    In the event of cardiac or respiratory ARREST Use medication by any route, position, wound care, and other measures to relive pain and suffering. May use oxygen, suction and manual treatment of airway obstruction as needed for comfort.        TOTAL TIME TAKING CARE OF THIS PATIENT: 55 minutes.    Dustin Flock M.D on 08/06/2015 at 7:13 PM  Between 7am to 6pm - Pager - [phone]  After 6pm go to [email or web address] - password [email or web address] [facility]  Office  [phone]  CC: Primary care physician; Juluis Pitch, MD

## 2015-08-06 NOTE — Progress Notes (Signed)

## 2015-08-07 LAB — CBC
HEMATOCRIT: 24.4 % — AB (ref 40.0–52.0)
HEMOGLOBIN: 8.1 g/dL — AB (ref 13.0–18.0)
MCH: 26.5 pg (ref 26.0–34.0)
MCHC: 33.3 g/dL (ref 32.0–36.0)
MCV: 79.6 fL — AB (ref 80.0–100.0)
Platelets: 117 10*3/uL — ABNORMAL LOW (ref 150–440)
RBC: 3.06 MIL/uL — ABNORMAL LOW (ref 4.40–5.90)
RDW: 20.9 % — ABNORMAL HIGH (ref 11.5–14.5)
WBC: 9.3 10*3/uL (ref 3.8–10.6)

## 2015-08-07 LAB — BASIC METABOLIC PANEL
ANION GAP: 7 (ref 5–15)
BUN: 32 mg/dL — ABNORMAL HIGH (ref 6–20)
CHLORIDE: 108 mmol/L (ref 101–111)
CO2: 24 mmol/L (ref 22–32)
Calcium: 8.5 mg/dL — ABNORMAL LOW (ref 8.9–10.3)
Creatinine, Ser: 1.31 mg/dL — ABNORMAL HIGH (ref 0.61–1.24)
GFR calc non Af Amer: 52 mL/min — ABNORMAL LOW (ref >60)
GLUCOSE: 107 mg/dL — AB (ref 65–99)
Potassium: 4.4 mmol/L (ref 3.5–5.1)
Sodium: 139 mmol/L (ref 135–145)

## 2015-08-07 LAB — VITAMIN B12: Vitamin B-12: 830 pg/mL (ref 180–914)

## 2015-08-07 LAB — TROPONIN I
Troponin I: 0.06 ng/mL — ABNORMAL HIGH (ref <0.031)
Troponin I: 0.06 ng/mL — ABNORMAL HIGH (ref <0.031)

## 2015-08-07 MED ORDER — DILTIAZEM HCL 60 MG PO TABS
60.0000 mg | ORAL_TABLET | Freq: Three times a day (TID) | ORAL | Status: DC
  Administered 2015-08-07 – 2015-08-08 (×4): 60 mg via ORAL
  Filled 2015-08-07 (×4): qty 1

## 2015-08-07 MED ORDER — ENSURE ENLIVE PO LIQD
237.0000 mL | Freq: Two times a day (BID) | ORAL | Status: DC
  Administered 2015-08-07 – 2015-08-08 (×2): 237 mL via ORAL

## 2015-08-07 MED ORDER — METOPROLOL TARTRATE 50 MG PO TABS
50.0000 mg | ORAL_TABLET | Freq: Two times a day (BID) | ORAL | Status: DC
  Administered 2015-08-07 – 2015-08-08 (×3): 50 mg via ORAL
  Filled 2015-08-07 (×3): qty 1

## 2015-08-07 NOTE — Progress Notes (Signed)
Physical Therapy Evaluation Patient Details Name: CERVANDO RAIRIGH MRN: [id number] DOB: Jul 19, 1940 Today's Date: 08/07/2015   History of Present Illness  Patient is a 75 y.o. male who works Land [employer]. Was admitted on 10 Feb. after going to urgent care for 2 mo. progressive weakness and finding elevated HR. PMH includes stage IV prostate CA, HLD, CAD, HTN, GERD. Had L TKA on 5 Dec. 2016.  Clinical Impression  Patient pleasant and cooperative with PT to complete evaluation. Patient admitted for tachycardia but had concern for gradual increasing weakness over past two months. Notes that he does feel better at this time, as no apparent deficits were noted upon evaluation. Patient is independent in performing bed mobility, transfers, and gait which is his baseline level of function. Patient does not require further PT services at this point in time and is safe to return home when medically stable.    Follow Up Recommendations No PT follow up    Equipment Recommendations  None recommended by PT    Recommendations for Other Services       Precautions / Restrictions Precautions Precautions: Fall Restrictions Weight Bearing Restrictions: No      Mobility  Bed Mobility Overal bed mobility: Independent             General bed mobility comments: Patient requires no assistance to perform bed mobility.  Transfers Overall transfer level: Independent Equipment used: Rolling walker (2 wheeled);None             General transfer comment: Patient moves from sit to stand and stand to sit with no LOB.  Ambulation/Gait Ambulation/Gait assistance: Modified independent (Device/Increase time) Ambulation Distance (Feet): 225 Feet Assistive device: Rolling walker (2 wheeled);None       General Gait Details: Patient ambulates 39' with RW, demonstrating good dynamic balance/control. Ambulates 67' with no AD without LOB.  Stairs            Wheelchair Mobility    Modified  Rankin (Stroke Patients Only)       Balance Overall balance assessment: Independent                                           Pertinent Vitals/Pain Pain Assessment: No/denies pain    Home Living Family/patient expects to be discharged to:: Private residence Living Arrangements: Spouse/significant other Available Help at Discharge: Family;Available 24 hours/day Type of Home: House Home Access: Stairs to enter Entrance Stairs-Rails: Right Entrance Stairs-Number of Steps: 5 Home Layout: Two level;Able to live on main level with bedroom/bathroom Home Equipment: Gilford Rile - 2 wheels      Prior Function Level of Independence: Independent               Hand Dominance   Dominant Hand: Right    Extremity/Trunk Assessment   Upper Extremity Assessment: Overall WFL for tasks assessed           Lower Extremity Assessment: Overall WFL for tasks assessed         Communication   Communication: No difficulties  Cognition Arousal/Alertness: Awake/alert Behavior During Therapy: WFL for tasks assessed/performed Overall Cognitive Status: Within Functional Limits for tasks assessed                      General Comments      Exercises        Assessment/Plan    PT Assessment  Patent does not need any further PT services  PT Diagnosis Generalized weakness   PT Problem List    PT Treatment Interventions     PT Goals (Current goals can be found in the Care Plan section) Acute Rehab PT Goals Patient Stated Goal: "To go home." PT Goal Formulation: With patient/family Time For Goal Achievement: 08/21/15 Potential to Achieve Goals: Good    Frequency     Barriers to discharge        Co-evaluation               End of Session Equipment Utilized During Treatment: Gait belt Activity Tolerance: Patient tolerated treatment well Patient left: in bed;with call bell/phone within reach;with bed alarm set;with family/visitor present            Time: [id number] PT Time Calculation (min) (ACUTE ONLY): 13 min   Charges:   PT Evaluation $PT Eval Low Complexity: 1 Procedure     PT G Codes:        Dorice Lamas, PT, DPT 08/07/2015, 2:16 PM

## 2015-08-07 NOTE — Progress Notes (Signed)
[place] at [facility]                                                                                                                                                                                            Patient Demographics   Jesi Bosserman, is a 75 y.o. male, DOB - 1941-03-21, AB:[id number]  Admit date - 08/06/2015   Admitting Physician Dustin Flock, MD  Outpatient Primary MD for the patient is DAVID Lovie Macadamia, MD   LOS - 1  Subjective: Patient feels better heart rate continues to be elevated with activity denies any chest pain     Review of Systems:   CONSTITUTIONAL: No documented fever. No fatigue, weakness. No weight gain, no weight loss.  EYES: No blurry or double vision.  ENT: No tinnitus. No postnasal drip. No redness of the oropharynx.  RESPIRATORY: No cough, no wheeze, no hemoptysis. No dyspnea.  CARDIOVASCULAR: No chest pain. No orthopnea. No palpitations. No syncope.  GASTROINTESTINAL: No nausea, no vomiting or diarrhea. No abdominal pain. No melena or hematochezia.  GENITOURINARY: No dysuria or hematuria.  ENDOCRINE: No polyuria or nocturia. No heat or cold intolerance.  HEMATOLOGY: No anemia. No bruising. No bleeding.  INTEGUMENTARY: No rashes. No lesions.  MUSCULOSKELETAL: No arthritis. No swelling. No gout.  NEUROLOGIC: No numbness, tingling, or ataxia. No seizure-type activity.  PSYCHIATRIC: No anxiety. No insomnia. No ADD.    Vitals:   Filed Vitals:   08/06/15 2000 08/06/15 2042 08/07/15 0421 08/07/15 0800  BP: 139/78 144/67 138/68 155/111  Pulse: 95 92 95 98  Temp:  98.2 F (36.8 C) 98.2 F (36.8 C) 98.2 F (36.8 C)  TempSrc:  Oral Oral Oral  Resp: 18 16 16 16   Height:      Weight:  87.181 kg (192 lb 3.2 oz)    SpO2: 98% 96% 98% 98%    Wt Readings from Last 3 Encounters:  08/06/15 87.181 kg (192 lb 3.2 oz)  07/21/15 89.812 kg (198 lb)  07/09/15 94.212 kg (207 lb 11.2 oz)     Intake/Output Summary  (Last 24 hours) at 08/07/15 0946 Last data filed at 08/07/15 0423  Gross per 24 hour  Intake      3 ml  Output    400 ml  Net   -397 ml    Physical Exam:   GENERAL: Pleasant-appearing in no apparent distress.  HEAD, EYES, EARS, NOSE AND THROAT: Atraumatic, normocephalic. Extraocular muscles are intact. Pupils equal and reactive to light. Sclerae anicteric. No conjunctival injection. No oro-pharyngeal erythema.  NECK: Supple. There is no jugular venous distention. No bruits, no lymphadenopathy,  no thyromegaly.  HEART: Regular rate and rhythm,. No murmurs, no rubs, no clicks.  LUNGS: Clear to auscultation bilaterally. No rales or rhonchi. No wheezes.  ABDOMEN: Soft, flat, nontender, nondistended. Has good bowel sounds. No hepatosplenomegaly appreciated.  EXTREMITIES: No evidence of any cyanosis, clubbing, or peripheral edema.  +2 pedal and radial pulses bilaterally.  NEUROLOGIC: The patient is alert, awake, and oriented x3 with no focal motor or sensory deficits appreciated bilaterally.  SKIN: Moist and warm with no rashes appreciated.  Psych: Not anxious, depressed LN: No inguinal LN enlargement    Antibiotics   Anti-infectives    None      Medications   Scheduled Meds: . calcium-vitamin D  1 tablet Oral BID  . clopidogrel  75 mg Oral Daily  . darifenacin  7.5 mg Oral Daily  . diltiazem  60 mg Oral 3 times per day  . ferrous sulfate  325 mg Oral BID WC  . fludrocortisone  0.1 mg Oral Daily  . megestrol  800 mg Oral Daily  . metoprolol tartrate  50 mg Oral BID  . multivitamin with minerals  0.5 tablet Oral BID  . niacin  500 mg Oral q morning - 10a  . niacin  1,000 mg Oral QPM  . pantoprazole  40 mg Oral Daily  . potassium chloride SA  20 mEq Oral BID  . rosuvastatin  20 mg Oral Daily  . sodium chloride flush  3 mL Intravenous Q12H  . venlafaxine XR  225 mg Oral Daily   Continuous Infusions: . sodium chloride 100 mL/hr at 08/07/15 0827   PRN Meds:.acetaminophen  **OR** acetaminophen, naproxen, ondansetron **OR** ondansetron (ZOFRAN) IV, oxyCODONE, traMADol   Data Review:   Micro Results No results found for this or any previous visit (from the past 240 hour(s)).  Radiology Reports Dg Chest 1 View  07/08/2015  CLINICAL DATA:  Shortness of breath, tachycardia EXAM: CHEST 1 VIEW COMPARISON:  10/30/2014 FINDINGS: Cardiomediastinal silhouette is stable. Status post CABG. No acute infiltrate or pleural effusion. No pulmonary edema. Chronic right AC joint separation is stable. IMPRESSION: No active disease. Electronically Signed   By: Lahoma Crocker M.D.   On: 07/08/2015 17:15   Ct Angio Chest Pe W/cm &/or Wo Cm  07/08/2015  CLINICAL DATA:  Stage IV prostate cancer. Shortness of breath and tachycardia. Recent knee surgery. EXAM: CT ANGIOGRAPHY CHEST WITH CONTRAST TECHNIQUE: Multidetector CT imaging of the chest was performed using the standard protocol during bolus administration of intravenous contrast. Multiplanar CT image reconstructions and MIPs were obtained to evaluate the vascular anatomy. CONTRAST:  60mL OMNIPAQUE IOHEXOL 350 MG/ML SOLN COMPARISON:  Chest radiograph from earlier today. NaF PET-CT from 07/29/2014. FINDINGS: Mediastinum/Nodes: The study is moderate quality for the evaluation of pulmonary embolism, limited by motion artifact, which limits evaluation of the segmental and subsegmental pulmonary artery branches, particularly in the left lung. There are no filling defects in the central, lobar, segmental or subsegmental pulmonary artery branches to suggest acute pulmonary embolism. Great vessels are normal in course and caliber. Normal heart size. No pericardial fluid/thickening. Left main, left anterior descending, left circumflex and right coronary atherosclerosis, status post CABG with left internal mammary and ascending aortic coronary bypass grafts. Normal visualized thyroid. Normal esophagus. No pathologically enlarged axillary, mediastinal or  hilar lymph nodes. Lungs/Pleura: No pneumothorax. No pleural effusion. Mild centrilobular emphysema. There are patchy regions of nodular consolidation and ground-glass opacity in the right middle lobe and basilar left lower lobe, which appear new, with the  largest nodular foci measuring 9 mm in the medial right middle lobe (series 6/ image 95) and 7 mm in the left lower lobe (series 6/ image 109). Upper abdomen: Cholecystectomy. Musculoskeletal: Median sternotomy wires appear aligned and intact. Extensive patchy sclerotic osseous metastases throughout the entire thoracic skeleton appear stable to slightly increased since 07/29/2014, for example a new sclerotic T8 vertebral metastasis (series 6/image 80). Review of the MIP images confirms the above findings. IMPRESSION: 1. No evidence of acute pulmonary embolism, with mild limitations as described. 2. New patchy regions of nodular consolidation and ground-glass opacity in the right middle lobe and basilar left lower lobe, favor a nonspecific infectious or inflammatory pneumonitis/bronchopneumonia. Recommend follow-up chest CT in 3 months. 3. 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 4. Coronary atherosclerosis status post CABG. Mild centrilobular emphysema. Electronically Signed   By: Ilona Sorrel M.D.   On: 07/08/2015 18:02   Nm Pet Mets (nopr) Whole Body (naf)  07/21/2015  CLINICAL DATA:  Prostate cancer. EXAM: NM PET BONE METS (NOPR) WHOLE BODY (NaF) TECHNIQUE: 6.47 mCi NaFl was injected intravenously. Full-ring PET imaging was performed from the vertex to the feet after the radiotracer. CT data was obtained and used for attenuation correction and anatomic localization. COMPARISON:  07/29/2014 FINDINGS: Whole-body PET bone scan: Multifocal areas of abnormal increased radiotracer uptake are identified within the axial and appendicular skeleton. When compared with the  previous exam the degree of abnormal uptake appears overall progressed.d. For example, there is a new focus of increased radiotracer uptake involving the L1 vertebral body. SUV max equals 33.89. On the previous exam this equaled 13.69. On the corresponding CT images there is new abnormal sclerosis involving this bone. Also new from the previous exam is uptake involving the T8 vertebra. SUV max equals 25.26. Previously 11.95. Increase in degree of abnormal uptake involving the body of the sternum. SUV max equals 47.93. Previously 19.45. Abnormal asymmetric increased uptake involving the right sacral wing has increased in size in the interval. The SUV max is stable at 24.9. Several areas of abnormal uptake have improved in the interval. For example, The previously noted abnormal uptake involving the L5 vertebral body is decreased. SUV max currently measures 19.35. Previously 34.18. Decrease in abnormal asymmetric uptake within the left posterior iliac bone. SUV max currently equals 17.1. Previously 40.21. CT findings: New enlarged pre aortic lymph node identified within the upper abdomen. This measures 1.4 cm, image 327 of series 3. Retrocaval adenopathy is also new from previous exam. Index node measures 1.3 cm, image 342 of series 3. Left periaortic lymph node at the level of the left renal hilum is also new. This measures 1.7 cm, image 327 of series 3. IMPRESSION: 1. 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 2. New enlarged retroperitoneal lymph nodes compatible with metastatic adenopathy. Electronically Signed   By: Kerby Moors M.D.   On: 07/21/2015 16:59   Nm Pet Mets (nopr) Whole Body (naf)  07/13/2015  CLINICAL DATA:  Prostate cancer.  Followup TECHNIQUE: 10.9 mCi F-18 FDG was injected intravenously. FINDINGS: The patient was claustrophobic and could not participate in the the scan PET-CT. No images acquired. IMPRESSION: Sodium fluoride PET-CT could not be completed  due to claustrophobia. Radiotracer injection as above. Electronically Signed   By: Suzy Bouchard M.D.   On: 07/13/2015 10:55     CBC  Recent Labs Lab 08/06/15 1218 08/07/15 0341  WBC 9.9  9.3  HGB 10.0* 8.1*  HCT 30.9* 24.4*  PLT 165 117*  MCV 80.6 79.6*  MCH 26.0 26.5  MCHC 32.3 33.3  RDW 20.9* 20.9*    Chemistries   Recent Labs Lab 08/06/15 1218 08/07/15 0341  NA 136 139  K 5.0 4.4  CL 102 108  CO2 21* 24  GLUCOSE 206* 107*  BUN 28* 32*  CREATININE 1.39* 1.31*  CALCIUM 9.7 8.5*   ------------------------------------------------------------------------------------------------------------------ estimated creatinine clearance is 55.1 mL/min (by C-G formula based on Cr of 1.31). ------------------------------------------------------------------------------------------------------------------ No results for input(s): HGBA1C in the last 72 hours. ------------------------------------------------------------------------------------------------------------------ No results for input(s): CHOL, HDL, LDLCALC, TRIG, CHOLHDL, LDLDIRECT in the last 72 hours. ------------------------------------------------------------------------------------------------------------------  Recent Labs  08/06/15 1934  TSH 2.484   ------------------------------------------------------------------------------------------------------------------ No results for input(s): VITAMINB12, FOLATE, FERRITIN, TIBC, IRON, RETICCTPCT in the last 72 hours.  Coagulation profile No results for input(s): INR, PROTIME in the last 168 hours.  No results for input(s): DDIMER in the last 72 hours.  Cardiac Enzymes  Recent Labs Lab 08/06/15 1218 08/06/15 1934 08/07/15 0341  TROPONINI 0.11* 0.06* 0.06*   ------------------------------------------------------------------------------------------------------------------ Invalid input(s): POCBNP    Assessment & Plan   ActiveIMPRESSION AND PLAN: patient  is a 75 year old white male with history of prostate cancer presents with generalized weakness tachycardia   1. Sinus tachycardia likely due to dehydration heart rate continues to be intermittently elevated I will add Cardizem to his current regimen increase his metoprolol dose, check echo cardiology eval  2. Generalized weakness PT evaluation pending vitamin D and B12 level pending  3. Poor appetite tinea Megace  4. Acute renal failure due to dehydration   Stable  5. Coronary artery disease with elevated troponin likely due to demand ischemia I will follow his troponin level continues current therapy  6. depression continue Effexor          Code Status Orders        Start     Ordered   08/06/15 2014  Full code   Continuous     08/06/15 2013    Code Status History    Date Active Date Inactive Code Status Order ID Comments User Context   07/08/2015  7:43 PM 07/14/2015  2:00 PM DNR [id number]  Vaughan Basta, MD ED   07/08/2015  7:42 PM 07/08/2015  7:43 PM Full Code [id number]  Vaughan Basta, MD ED   05/31/2015  4:55 PM 06/02/2015  2:55 PM DNR [id number]  Haynes Dage, RN Inpatient   05/31/2015  1:02 PM 05/31/2015  4:55 PM Full Code [id number]  [name], MD Inpatient           Consults cardiology  DVT Prophylaxis  D/c lovenox due to drop in platelets, ambulator  Lab Results  Component Value Date   PLT 117* 08/07/2015     Time Spent in minutes   46min  Dustin Flock M.D on 08/07/2015 at 9:46 AM  Between 7am to 6pm - Pager - [phone]  After 6pm go to [email or web address] - password [email or web address] [facility]   Office  [phone]

## 2015-08-07 NOTE — Progress Notes (Signed)
MD, Dr. Jannifer Franklin notified to change pt code status from full to DNR per pt request.  Pt says that should be on file [facility]

## 2015-08-07 NOTE — Progress Notes (Signed)
Initial Nutrition Assessment  DOCUMENTATION CODES:   Severe malnutrition in context of chronic illness  INTERVENTION:   Meals and Snacks: Cater to patient preferences; will send plastic utensils Medical Food Supplement Therapy: will recommend Ensure Enlive po BID, each supplement provides 350 kcal and 20 grams of protein   NUTRITION DIAGNOSIS:   Malnutrition related to chronic illness, cancer and cancer related treatments as evidenced by percent weight loss, energy intake < or equal to 75% for > or equal to 1 month, mild depletion of body fat, mild depletion of muscle mass.  GOAL:   Patient will meet greater than or equal to 90% of their needs  MONITOR:    (Energy Intake, Electrolyte and renal Profile, Anthropometrics, Digestive System)  REASON FOR ASSESSMENT:   Malnutrition Screening Tool    ASSESSMENT:   Pt admitted with dizziness and tachycardia with acute renal failure. Pt with h/o stage IV prostate cancer on chemotherapy.  Past Medical History  Diagnosis Date  . Prostate cancer ([place])     metastatic to bone  . Hypercholesteremia   . CAD (coronary artery disease)   . HTN (hypertension)   . Myocardial infarction ([place])   . GERD (gastroesophageal reflux disease)   . Neuropathy ([place])      Diet Order:  Diet Heart Room service appropriate?: Yes; Fluid consistency:: Thin    Current Nutrition: Pt ate 100% of lunch today and reports eating a very good breakfast. Pt very pleased with intake today and did report to RD that he had been started on appetite stimulant, noted Megace ordered.  Food/Nutrition-Related History: Pt reports very poor po intake for months however after last admission, last month, very slowly appetite has been picking back up however not 100% yet. Pt reports trying to drink Ensure every morning. Upon chart review last admission 1/12-1/17 pt eating very poorly, bites at best and had reported at that time 2-3 months of poor po intake. Pt reports having  gotten a case of Ensure from [place]. Pt reports 'everything tastes like aluminum therefore I hardly wanted to eat at all.'   Scheduled Medications:  . calcium-vitamin D  1 tablet Oral BID  . clopidogrel  75 mg Oral Daily  . darifenacin  7.5 mg Oral Daily  . diltiazem  60 mg Oral 3 times per day  . feeding supplement (ENSURE ENLIVE)  237 mL Oral BID WC  . ferrous sulfate  325 mg Oral BID WC  . fludrocortisone  0.1 mg Oral Daily  . megestrol  800 mg Oral Daily  . metoprolol tartrate  50 mg Oral BID  . multivitamin with minerals  0.5 tablet Oral BID  . niacin  500 mg Oral q morning - 10a  . niacin  1,000 mg Oral QPM  . pantoprazole  40 mg Oral Daily  . potassium chloride SA  20 mEq Oral BID  . rosuvastatin  20 mg Oral Daily  . sodium chloride flush  3 mL Intravenous Q12H  . venlafaxine XR  225 mg Oral Daily    Continuous Medications:  . sodium chloride Stopped (08/07/15 1201)     Electrolyte/Renal Profile and Glucose Profile:   Recent Labs Lab 08/06/15 1218 08/07/15 0341  NA 136 139  K 5.0 4.4  CL 102 108  CO2 21* 24  BUN 28* 32*  CREATININE 1.39* 1.31*  CALCIUM 9.7 8.5*  GLUCOSE 206* 107*   Protein Profile: No results for input(s): ALBUMIN in the last 168 hours.  Gastrointestinal Profile: Last BM:  Nutrition-Focused Physical Exam Findings: Nutrition-Focused physical exam completed. Findings are mild fat depletion, mild muscle depletion, and no edema.     Weight Change: Pt reports weight of 245lbs before weight started dropping off months ago. Per [facility] weight of 236lbs September 2016 (19% weight loss in 6 months)    Height:   Ht Readings from Last 1 Encounters:  08/06/15 6' (1.829 m)    Weight:   Wt Readings from Last 1 Encounters:  08/06/15 192 lb 3.2 oz (87.181 kg)   Wt Readings from Last 10 Encounters:  08/06/15 192 lb 3.2 oz (87.181 kg)  07/21/15 198 lb (89.812 kg)  07/09/15 207 lb 11.2 oz (94.212 kg)  06/28/15 200 lb (90.719 kg)   06/19/15 203 lb (92.08 kg)  05/31/15 223 lb (101.152 kg)  05/19/15 223 lb 8.7 oz (101.4 kg)  05/12/15 220 lb (99.791 kg)  04/14/15 231 lb 7.7 oz (105 kg)  03/17/15 236 lb 15.9 oz (107.5 kg)    BMI:  Body mass index is 26.06 kg/(m^2).  Estimated Nutritional Needs:   Kcal:  BEE: 1648kcals, TEE: (IF 1.1-1.3)(AF 1.2) 2175-2570kcals  Protein:  87-104g protein (1.0-1.2g/kg)  Fluid:  2175-2665mL of fluid (25-74mL/kg)  EDUCATION NEEDS:   Education needs no appropriate at this time   [place], RD, LDN Pager [phone] Weekend/On-Call Pager [phone]

## 2015-08-08 LAB — BASIC METABOLIC PANEL
Anion gap: 11 (ref 5–15)
BUN: 20 mg/dL (ref 6–20)
CHLORIDE: 103 mmol/L (ref 101–111)
CO2: 22 mmol/L (ref 22–32)
CREATININE: 0.9 mg/dL (ref 0.61–1.24)
Calcium: 8.7 mg/dL — ABNORMAL LOW (ref 8.9–10.3)
Glucose, Bld: 94 mg/dL (ref 65–99)
POTASSIUM: 3.9 mmol/L (ref 3.5–5.1)
SODIUM: 136 mmol/L (ref 135–145)

## 2015-08-08 LAB — CBC
HCT: 24.8 % — ABNORMAL LOW (ref 40.0–52.0)
HEMOGLOBIN: 8.2 g/dL — AB (ref 13.0–18.0)
MCH: 26.1 pg (ref 26.0–34.0)
MCHC: 32.8 g/dL (ref 32.0–36.0)
MCV: 79.4 fL — ABNORMAL LOW (ref 80.0–100.0)
PLATELETS: 116 10*3/uL — AB (ref 150–440)
RBC: 3.13 MIL/uL — AB (ref 4.40–5.90)
RDW: 20.4 % — ABNORMAL HIGH (ref 11.5–14.5)
WBC: 15.3 10*3/uL — ABNORMAL HIGH (ref 3.8–10.6)

## 2015-08-08 MED ORDER — METOPROLOL TARTRATE 100 MG PO TABS: 100.0000 mg | ORAL_TABLET | Freq: Two times a day (BID) | ORAL | Status: AC

## 2015-08-08 NOTE — Discharge Instructions (Signed)

## 2015-08-08 NOTE — Discharge Summary (Signed)
Derek Parker, 75 y.o., DOB [birth year], MRN [id number]. Admission date: 08/06/2015 Discharge Date 08/08/2015 Primary MD DAVID Lovie Macadamia, MD Admitting Physician Dustin Flock, MD  Admission Diagnosis  Dehydration [E86.0] Elevated troponin [R79.89] Acute renal failure, unspecified acute renal failure type [facility]) [N17.9]  Discharge Diagnosis   Active Problems:   Tachycardia sinus   Weakness   ARF   CAD  Elevated trop due to dmenad ischemia depression    [place] is a 75 y.o. male with a known history of prostate cancer stage IV who received his first chemo on Monday, hyper lipidemia, coronary artery disease, hypertension, GERD who presents with generalized weakness. patient went to see his primary care provider but they referred him to urgent care where they noticed his heart rate to be very elevated therefore he was sent to the ER. In the ER he is noted to have elevated heart rate. HE was noted to be dehydrate,he was given IVF and his metoprolol adjusted, his hr much improved. Patient feels much better and stable to d/c home.            Consults  None  Significant Tests:  See full reports for all details      Nm Pet Mets (nopr) Whole Body (naf)  07/21/2015  CLINICAL DATA:  Prostate cancer. EXAM: NM PET BONE METS (NOPR) WHOLE BODY (NaF) TECHNIQUE: 6.47 mCi NaFl was injected intravenously. Full-ring PET imaging was performed from the vertex to the feet after the radiotracer. CT data was obtained and used for attenuation correction and anatomic localization. COMPARISON:  07/29/2014 FINDINGS: Whole-body PET bone scan: Multifocal areas of abnormal increased radiotracer uptake are identified within the axial and appendicular skeleton. When compared with the previous exam the degree of abnormal uptake appears overall progressed.d. For example, there is a new focus of increased radiotracer uptake involving the L1 vertebral body. SUV max equals 33.89. On the previous exam  this equaled 13.69. On the corresponding CT images there is new abnormal sclerosis involving this bone. Also new from the previous exam is uptake involving the T8 vertebra. SUV max equals 25.26. Previously 11.95. Increase in degree of abnormal uptake involving the body of the sternum. SUV max equals 47.93. Previously 19.45. Abnormal asymmetric increased uptake involving the right sacral wing has increased in size in the interval. The SUV max is stable at 24.9. Several areas of abnormal uptake have improved in the interval. For example, The previously noted abnormal uptake involving the L5 vertebral body is decreased. SUV max currently measures 19.35. Previously 34.18. Decrease in abnormal asymmetric uptake within the left posterior iliac bone. SUV max currently equals 17.1. Previously 40.21. CT findings: New enlarged pre aortic lymph node identified within the upper abdomen. This measures 1.4 cm, image 327 of series 3. Retrocaval adenopathy is also new from previous exam. Index node measures 1.3 cm, image 342 of series 3. Left periaortic lymph node at the level of the left renal hilum is also new. This measures 1.7 cm, image 327 of series 3. IMPRESSION: 1. 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 2. New enlarged retroperitoneal lymph nodes compatible with metastatic adenopathy. Electronically Signed   By: Kerby Moors M.D.   On: 07/21/2015 16:59       Today   Subjective:   Derek Parker  Feels better wants to go home  Objective:   Blood pressure 128/61, pulse 74, temperature 98.6 F (37 C), temperature source Oral, resp. rate 20, height 6' (1.829 m), weight  87.181 kg (192 lb 3.2 oz), SpO2 98 %.  .  Intake/Output Summary (Last 24 hours) at 08/08/15 1322 Last data filed at 08/08/15 0900  Gross per 24 hour  Intake    240 ml  Output    200 ml  Net     40 ml    Exam VITAL SIGNS: Blood pressure 128/61, pulse 74, temperature 98.6 F (37 C), temperature  source Oral, resp. rate 20, height 6' (1.829 m), weight 87.181 kg (192 lb 3.2 oz), SpO2 98 %.  GENERAL:  75 y.o.-year-old patient lying in the bed with no acute distress.  EYES: Pupils equal, round, reactive to light and accommodation. No scleral icterus. Extraocular muscles intact.  HEENT: Head atraumatic, normocephalic. Oropharynx and nasopharynx clear.  NECK:  Supple, no jugular venous distention. No thyroid enlargement, no tenderness.  LUNGS: Normal breath sounds bilaterally, no wheezing, rales,rhonchi or crepitation. No use of accessory muscles of respiration.  CARDIOVASCULAR: S1, S2 normal. No murmurs, rubs, or gallops.  ABDOMEN: Soft, nontender, nondistended. Bowel sounds present. No organomegaly or mass.  EXTREMITIES: No pedal edema, cyanosis, or clubbing.  NEUROLOGIC: Cranial nerves II through XII are intact. Muscle strength 5/5 in all extremities. Sensation intact. Gait not checked.  PSYCHIATRIC: The patient is alert and oriented x 3.  SKIN: No obvious rash, lesion, or ulcer.   Data Review     CBC w Diff: Lab Results  Component Value Date   WBC 15.3* 08/08/2015   WBC 5.8 10/19/2014   HGB 8.2* 08/08/2015   HGB 13.8 10/19/2014   HCT 24.8* 08/08/2015   HCT 40.9 10/19/2014   PLT 116* 08/08/2015   PLT 170 10/19/2014   LYMPHOPCT 11 07/08/2015   LYMPHOPCT 15.3 10/19/2014   MONOPCT 9 07/08/2015   MONOPCT 7.9 10/19/2014   EOSPCT 0 07/08/2015   EOSPCT 3.0 10/19/2014   BASOPCT 1 07/08/2015   BASOPCT 0.8 10/19/2014   CMP: Lab Results  Component Value Date   NA 136 08/08/2015   K 3.9 08/08/2015   CL 103 08/08/2015   CO2 22 08/08/2015   BUN 20 08/08/2015   CREATININE 0.90 08/08/2015   PROT 7.1 06/28/2015   ALBUMIN 3.3* 06/28/2015   BILITOT 0.5 06/28/2015   ALKPHOS 100 06/28/2015   AST 53* 06/28/2015   ALT 26 06/28/2015  .  Micro Results No results found for this or any previous visit (from the past 240 hour(s)).      Code Status Orders        Start      Ordered   08/07/15 2131  Do not attempt resuscitation (DNR)   Continuous    Question Answer Comment  In the event of cardiac or respiratory ARREST Do not call a "code blue"   In the event of cardiac or respiratory ARREST Do not perform Intubation, CPR, defibrillation or ACLS   In the event of cardiac or respiratory ARREST Use medication by any route, position, wound care, and other measures to relive pain and suffering. May use oxygen, suction and manual treatment of airway obstruction as needed for comfort.      08/07/15 2130    Code Status History    Date Active Date Inactive Code Status Order ID Comments User Context   08/06/2015  8:13 PM 08/07/2015  9:30 PM Full Code [id number]  Dustin Flock, MD ED   07/08/2015  7:43 PM 07/14/2015  2:00 PM DNR [id number]  Vaughan Basta, MD ED   07/08/2015  7:42 PM 07/08/2015  7:43 PM Full  Code [id number]  Vaughan Basta, MD ED   05/31/2015  4:55 PM 06/02/2015  2:55 PM DNR [id number]  Haynes Dage, RN Inpatient   05/31/2015  1:02 PM 05/31/2015  4:55 PM Full Code [id number]  [name], MD Inpatient          Follow-up Information    Follow up with DAVID Lovie Macadamia, MD. Schedule an appointment as soon as possible for a visit in 1 week.   Specialty:  Family Medicine   Contact information:   [place] [address] [phone]       Discharge Medications     Medication List    STOP taking these medications        amoxicillin-clavulanate 875-125 MG tablet  Commonly known as:  AUGMENTIN      TAKE these medications        CALCIUM 600+D 600-800 MG-UNIT Tabs  Generic drug:  Calcium Carb-Cholecalciferol  Take 1 tablet by mouth 2 (two) times daily.     clopidogrel 75 MG tablet  Commonly known as:  PLAVIX  Take 75 mg by mouth daily.     CO Q 10 PO  Take 1 capsule by mouth daily.     dexlansoprazole 60 MG capsule  Commonly known as:  DEXILANT  Take 60 mg by mouth daily.     ferrous sulfate 325 (65 FE) MG  tablet  Take 325 mg by mouth daily with breakfast.     fludrocortisone 0.1 MG tablet  Commonly known as:  FLORINEF  Take 0.1 mg by mouth daily.     metoprolol 100 MG tablet  Commonly known as:  LOPRESSOR  Take 1 tablet (100 mg total) by mouth 2 (two) times daily.     multivitamin with minerals Tabs tablet  Take 0.5 tablets by mouth 2 (two) times daily.     naproxen sodium 220 MG tablet  Commonly known as:  ANAPROX  Take 220 mg by mouth 2 (two) times daily as needed (for pain).     niacin 500 MG CR tablet  Commonly known as:  NIASPAN  Take 500-1,000 mg by mouth 2 (two) times daily. Pt takes one tablet in the morning and two in the evening.     oxyCODONE 5 MG immediate release tablet  Commonly known as:  Oxy IR/ROXICODONE  Take 1-2 tablets (5-10 mg total) by mouth every 4 (four) hours as needed for severe pain or breakthrough pain.     potassium chloride SA 20 MEQ tablet  Commonly known as:  K-DUR,KLOR-CON  Take 20 mEq by mouth 2 (two) times daily.     rosuvastatin 20 MG tablet  Commonly known as:  CRESTOR  Take 20 mg by mouth daily.     solifenacin 5 MG tablet  Commonly known as:  VESICARE  Take 5 mg by mouth daily.     traMADol 50 MG tablet  Commonly known as:  ULTRAM  Take 1-2 tablets (50-100 mg total) by mouth every 4 (four) hours as needed for moderate pain.     venlafaxine XR 75 MG 24 hr capsule  Commonly known as:  EFFEXOR-XR  Take 225 mg by mouth daily.           Total Time in preparing paper work, data evaluation and todays exam - 35 minutes  Dustin Flock M.D on 08/08/2015 at 1:22 PM  [facility]   Office  [phone]

## 2015-08-08 NOTE — Progress Notes (Signed)
Pt to be discharged today. Iv and tele removed. Discharge instructions and prescrips given to pt and his wife. disch via w.c. Accompanied by wife

## 2015-08-09 LAB — CALCITRIOL (1,25 DI-OH VIT D): Vit D, 1,25-Dihydroxy: 59.7 pg/mL (ref 19.9–79.3)

## 2015-09-24 ENCOUNTER — Ambulatory Visit: Payer: Medicare Other | Admitting: Radiation Oncology

## 2015-09-24 ENCOUNTER — Ambulatory Visit: Admission: RE | Admit: 2015-09-24 | Payer: Medicare Other | Source: Ambulatory Visit | Admitting: Radiation Oncology

## 2015-10-01 ENCOUNTER — Encounter: Payer: Self-pay | Admitting: *Deleted

## 2015-10-01 ENCOUNTER — Encounter: Payer: Self-pay | Admitting: Radiation Oncology

## 2015-10-01 ENCOUNTER — Ambulatory Visit
Admission: RE | Admit: 2015-10-01 | Discharge: 2015-10-01 | Disposition: A | Discharge status: Home / Self Care | Payer: Medicare Other | Source: Ambulatory Visit | Attending: Radiation Oncology | Admitting: Radiation Oncology

## 2015-10-01 VITALS — BP 129/76 | HR 91 | Temp 97.2°F | Resp 20 | Wt 199.0 lb

## 2015-10-01 DIAGNOSIS — C61 Malignant neoplasm of prostate: Secondary | ICD-10-CM

## 2015-10-01 DIAGNOSIS — C7951 Secondary malignant neoplasm of bone: Principal | ICD-10-CM

## 2015-10-01 NOTE — Progress Notes (Signed)
Radiation Oncology Follow up Note  Name: Derek Parker   Date:   10/01/2015 MRN:  [id number] DOB: Mar 15, 1941    This 75 y.o. male presents to the clinic today for follow-up forXofigo treatment now out 5 months.  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 He is seen today in routine follow-up is doing fairly well. He does have recent PET CT scan. Showing some progression of bony metastasis and new enlarged retroperitoneal lymph nodes compatible with metastatic disease. He is currently on Taxotere. He specifically denies any areas of bony pain he also has had a knee replacement not related to cancer which was successful and he is ambulating well.  COMPLICATIONS OF TREATMENT: none  FOLLOW UP COMPLIANCE: keeps appointments   PHYSICAL EXAM:  BP 129/76 mmHg  Pulse 91  Temp(Src) 97.2 F (36.2 C)  Resp 20  Wt 198 lb 15.4 oz (90.25 kg) Well-developed frail-appearing male in NAD. Deep palpation of his spine does not elicit pain range of motion his lower extremities does not elicit pain. Motor sensory and DTR levels are equal and symmetric in the upper lower extremities. Well-developed well-nourished patient in NAD. HEENT reveals PERLA, EOMI, discs not visualized.  Oral cavity is clear. No oral mucosal lesions are identified. Neck is clear without evidence of cervical or supraclavicular adenopathy. Lungs are clear to A&P. Cardiac examination is essentially unremarkable with regular rate and rhythm without murmur rub or thrill. Abdomen is benign with no organomegaly or masses noted. Motor sensory and DTR levels are equal and symmetric in the upper and lower extremities. Cranial nerves II through XII are grossly intact. Proprioception is intact. No peripheral adenopathy or edema is identified. No motor or sensory levels are noted. Crude visual fields are  within normal range.  RADIOLOGY RESULTS: PET CT scan is reviewed compatible with the above-stated findings  PLAN: At the present time he is currently on chemotherapy for his hormone refractory prostate cancer. Probably pain and palliative standpoint he is doing well. I see no areas at this time that needs palliative attention. I've asked to see him back in 6 months for follow-up. I've asked him to contact me should any areas develop pain which we may consider palliative treatment for.  I would like to take this opportunity for allowing me to participate in the care of your patient.[name]., MD

## 2015-11-29 ENCOUNTER — Emergency Department: Payer: Medicare Other

## 2015-11-29 ENCOUNTER — Emergency Department
Admission: EM | Admit: 2015-11-29 | Discharge: 2015-11-29 | Disposition: A | Discharge status: Home / Self Care | Payer: Medicare Other | Attending: Emergency Medicine | Admitting: Emergency Medicine

## 2015-11-29 DIAGNOSIS — R51 Headache: Secondary | ICD-10-CM | POA: Diagnosis present

## 2015-11-29 DIAGNOSIS — Y999 Unspecified external cause status: Secondary | ICD-10-CM | POA: Diagnosis not present

## 2015-11-29 DIAGNOSIS — I252 Old myocardial infarction: Secondary | ICD-10-CM | POA: Insufficient documentation

## 2015-11-29 DIAGNOSIS — Z87891 Personal history of nicotine dependence: Secondary | ICD-10-CM | POA: Diagnosis not present

## 2015-11-29 DIAGNOSIS — Y929 Unspecified place or not applicable: Secondary | ICD-10-CM | POA: Diagnosis not present

## 2015-11-29 DIAGNOSIS — W01198A Fall on same level from slipping, tripping and stumbling with subsequent striking against other object, initial encounter: Secondary | ICD-10-CM | POA: Diagnosis not present

## 2015-11-29 DIAGNOSIS — Y9389 Activity, other specified: Secondary | ICD-10-CM | POA: Diagnosis not present

## 2015-11-29 DIAGNOSIS — I10 Essential (primary) hypertension: Secondary | ICD-10-CM | POA: Insufficient documentation

## 2015-11-29 DIAGNOSIS — Z79899 Other long term (current) drug therapy: Secondary | ICD-10-CM | POA: Diagnosis not present

## 2015-11-29 DIAGNOSIS — S0990XA Unspecified injury of head, initial encounter: Secondary | ICD-10-CM

## 2015-11-29 DIAGNOSIS — W19XXXA Unspecified fall, initial encounter: Secondary | ICD-10-CM

## 2015-11-29 DIAGNOSIS — I2510 Atherosclerotic heart disease of native coronary artery without angina pectoris: Secondary | ICD-10-CM | POA: Diagnosis not present

## 2015-11-29 NOTE — ED Notes (Signed)
Discharge instructions reviewed with patient. Questions fielded by this RN. Patient verbalizes understanding of instructions. Patient discharged home in stable condition per Paduchowski MD . No acute distress noted at time of discharge.   

## 2015-11-29 NOTE — ED Notes (Signed)
Pt in with co hematoma to back of head, he tripped and fell backwards on driveway.  No loc noted pt is on plavix, pt alert and awake.

## 2015-11-29 NOTE — ED Provider Notes (Signed)
[facility] Emergency Department Provider Note  Time seen: 9:58 PM  I have reviewed the triage vital signs and the nursing notes.   HISTORY  Chief Complaint Fall    HPI Derek Parker is a 75 y.o. male with a past medical history of hypertension, hyperlipidemia, CAD status post cardiac stents, on Plavix who presents the emergency department after a fall. According to the patient at approximately 7 PM tonight he was getting out of his car when he tripped falling backwards landing on his back and head on the cement. Denies LOC. Denies nausea or vomiting. Patient has been ambulating since the event without issue. States mild head pain. Denies any neck pain. Patient was concerned as he takes Plavix so he came to the emergency department for evaluation.     Past Medical History  Diagnosis Date  . Prostate cancer ([place])     metastatic to bone  . Hypercholesteremia   . CAD (coronary artery disease)   . HTN (hypertension)   . Myocardial infarction ([place])   . GERD (gastroesophageal reflux disease)   . Neuropathy [facility])     Patient Active Problem List   Diagnosis Date Noted  . Tachycardia 08/06/2015  . Protein-calorie malnutrition, severe 07/09/2015  . Prostate cancer metastatic to bone ([place])   . Pneumonia 07/08/2015  . Sinus tachycardia ([place]) 07/08/2015  . 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 Coronary artery bypass graft    . Umbilical hernia repair    . Cholecystectomy    . Knee surgery Left   . Carotid stent    . Knee surgery Left 2006  . Hernia repair    . Cataract extraction    . Knee arthroplasty Left 05/31/2015    Procedure: COMPUTER ASSISTED TOTAL KNEE ARTHROPLASTY;  Surgeon: [name], MD;  Location: [facility] ORS;  Service: Orthopedics;  Laterality: Left;    Current Outpatient Rx  Name  Route  Sig  Dispense  Refill  . Calcium Carb-Cholecalciferol (CALCIUM 600+D) 600-800 MG-UNIT  TABS   Oral   Take 1 tablet by mouth 2 (two) times daily.         . clopidogrel (PLAVIX) 75 MG tablet   Oral   Take 75 mg by mouth daily.         . Coenzyme Q10 (CO Q 10 PO)   Oral   Take 1 capsule by mouth daily.         Marland Kitchen dexlansoprazole (DEXILANT) 60 MG capsule   Oral   Take 60 mg by mouth daily.          . ferrous sulfate 325 (65 FE) MG tablet   Oral   Take 325 mg by mouth daily with breakfast.         . fludrocortisone (FLORINEF) 0.1 MG tablet   Oral   Take 0.1 mg by mouth daily.         . metoprolol (LOPRESSOR) 100 MG tablet   Oral   Take 1 tablet (100 mg total) by mouth 2 (two) times daily.   60 tablet   0   . Multiple Vitamin (MULTIVITAMIN WITH MINERALS) TABS tablet   Oral   Take 0.5 tablets by mouth 2 (two) times daily.         . naproxen sodium (ANAPROX) 220 MG tablet   Oral   Take 220 mg by mouth 2 (two) times daily as needed (for pain).         Marland Kitchen  niacin (NIASPAN) 500 MG CR tablet   Oral   Take 500-1,000 mg by mouth 2 (two) times daily. Pt takes one tablet in the morning and two in the evening.         Marland Kitchen oxyCODONE (OXY IR/ROXICODONE) 5 MG immediate release tablet   Oral   Take 1-2 tablets (5-10 mg total) by mouth every 4 (four) hours as needed for severe pain or breakthrough pain.   80 tablet   0   . potassium chloride SA (K-DUR,KLOR-CON) 20 MEQ tablet   Oral   Take 20 mEq by mouth 2 (two) times daily.         . rosuvastatin (CRESTOR) 20 MG tablet   Oral   Take 20 mg by mouth daily.         . solifenacin (VESICARE) 5 MG tablet   Oral   Take 5 mg by mouth daily.          . traMADol (ULTRAM) 50 MG tablet   Oral   Take 1-2 tablets (50-100 mg total) by mouth every 4 (four) hours as needed for moderate pain.   60 tablet   1   . venlafaxine XR (EFFEXOR-XR) 75 MG 24 hr capsule   Oral   Take 225 mg by mouth daily.            Allergies Review of patient's allergies indicates no known allergies.  Family History   Problem Relation Age of Onset  . Cancer Father     prostate    Social History Social History  Substance Use Topics  . Smoking status: Former Smoker -- 40 years  . Smokeless tobacco: Not on file  . Alcohol Use: 0.6 oz/week    1 Shots of liquor per week    Review of Systems Constitutional: Negative for fever. Cardiovascular: Negative for chest pain. Respiratory: Negative for shortness of breath. Gastrointestinal: Negative for abdominal pain. Negative for vomiting. Musculoskeletal: Negative for back pain. Negative for neck pain. Skin: Abrasion to the back of the head. Neurological: Mild headache. 10-point ROS otherwise negative.  ____________________________________________   PHYSICAL EXAM:  VITAL SIGNS: ED Triage Vitals  Enc Vitals Group     BP 11/29/15 2053 126/73 mmHg     Pulse Rate 11/29/15 2053 95     Resp 11/29/15 2053 18     Temp 11/29/15 2053 97.9 F (36.6 C)     Temp Source 11/29/15 2053 Oral     SpO2 11/29/15 2053 99 %     Weight 11/29/15 2053 190 lb (86.183 kg)     Height 11/29/15 2053 6' (1.829 m)     Head Cir --      Peak Flow --      Pain Score 11/29/15 2053 5     Pain Loc --      Pain Edu? --      Excl. in [place]? --     Constitutional: Alert and oriented. Well appearing and in no distress. Eyes: Normal exam ENT   Head: Normocephalic and atraumatic.   Mouth/Throat: Mucous membranes are moist. Cardiovascular: Normal rate, regular rhythm Respiratory: Normal respiratory effort without tachypnea nor retractions. Breath sounds are clear  Gastrointestinal: Soft and nontender. No distention.   Musculoskeletal: Nontender with normal range of motion in all extremities. Atraumatic extremities. No neck or back tenderness to palpation. Neurologic:  Normal speech and language. No gross focal neurologic deficits  Skin:  Skin is warm, dry and intact.  Psychiatric: Mood and affect are normal.  ____________________________________________      RADIOLOGY  CT head and C-spine show no acute abnormality   INITIAL IMPRESSION / ASSESSMENT AND PLAN / ED COURSE  Pertinent labs & imaging results that were available during my care of the patient were reviewed by me and considered in my medical decision making (see chart for details).  Patient presents the emergency department after mechanical fall hitting his head. Patient takes Plavix so he was concerned about possible intracranial abnormality. CT scan of the head and neck are negative. Patient appears very well he does have a moderate abrasion to the occipital scalp, minimal hematoma. No bleeding. With normal CT scans we'll discharge patient home with PCP follow-up if needed.  ____________________________________________   FINAL CLINICAL IMPRESSION(S) / ED DIAGNOSES  Fall, closed head injury   Harvest Dark, MD 11/29/15 2200

## 2015-11-29 NOTE — ED Notes (Signed)
Patient transported to CT 

## 2015-11-29 NOTE — ED Notes (Signed)
Patient transported to back to room

## 2015-11-29 NOTE — Discharge Instructions (Signed)
Head Injury, Adult °You have a head injury. Headaches and throwing up (vomiting) are common after a head injury. It should be easy to wake up from sleeping. Sometimes you must stay in the hospital. Most problems happen within the first 24 hours. Side effects may occur up to 7-10 days after the injury.  °WHAT ARE THE TYPES OF HEAD INJURIES? °Head injuries can be as minor as a bump. Some head injuries can be more severe. More severe head injuries include: °· A jarring injury to the brain (concussion). °· A bruise of the brain (contusion). This mean there is bleeding in the brain that can cause swelling. °· A cracked skull (skull fracture). °· Bleeding in the brain that collects, clots, and forms a bump (hematoma). °WHEN SHOULD I GET HELP RIGHT AWAY?  °· You are confused or sleepy. °· You cannot be woken up. °· You feel sick to your stomach (nauseous) or keep throwing up (vomiting). °· Your dizziness or unsteadiness is getting worse. °· You have very bad, lasting headaches that are not helped by medicine. Take medicines only as told by your doctor. °· You cannot use your arms or legs like normal. °· You cannot walk. °· You notice changes in the black spots in the center of the colored part of your eye (pupil). °· You have clear or bloody fluid coming from your nose or ears. °· You have trouble seeing. °During the next 24 hours after the injury, you must stay with someone who can watch you. This person should get help right away (call 911 in the [place] if you start to shake and are not able to control it (have seizures), you pass out, or you are unable to wake up. °HOW CAN I PREVENT A HEAD INJURY IN THE FUTURE? °· Wear seat belts. °· Wear a helmet while bike riding and playing sports like football. °· Stay away from dangerous activities around the house. °WHEN CAN I RETURN TO NORMAL ACTIVITIES AND ATHLETICS? °See your doctor before doing these activities. You should not do normal activities or play contact sports until 1  week after the following symptoms have stopped: °· Headache that does not go away. °· Dizziness. °· Poor attention. °· Confusion. °· Memory problems. °· Sickness to your stomach or throwing up. °· Tiredness. °· Fussiness. °· Bothered by bright lights or loud noises. °· Anxiousness or depression. °· Restless sleep. °MAKE SURE YOU:  °· Understand these instructions. °· Will watch your condition. °· Will get help right away if you are not doing well or get worse. °  °This information is not intended to replace advice given to you by your health care provider. Make sure you discuss any questions you have with your health care provider. °  °Document Released: 05/25/2008 Document Revised: 07/03/2014 Document Reviewed: 02/17/2013 °Elsevier Interactive Patient Education ©2016 [employer] ° °

## 2015-12-12 ENCOUNTER — Emergency Department
Admission: EM | Admit: 2015-12-12 | Discharge: 2015-12-12 | Disposition: A | Discharge status: Home / Self Care | Payer: Medicare Other | Attending: Emergency Medicine | Admitting: Emergency Medicine

## 2015-12-12 ENCOUNTER — Emergency Department: Payer: Medicare Other

## 2015-12-12 ENCOUNTER — Other Ambulatory Visit: Payer: Self-pay

## 2015-12-12 ENCOUNTER — Encounter: Payer: Self-pay | Admitting: Emergency Medicine

## 2015-12-12 DIAGNOSIS — Z79899 Other long term (current) drug therapy: Secondary | ICD-10-CM | POA: Insufficient documentation

## 2015-12-12 DIAGNOSIS — I10 Essential (primary) hypertension: Secondary | ICD-10-CM | POA: Insufficient documentation

## 2015-12-12 DIAGNOSIS — C61 Malignant neoplasm of prostate: Secondary | ICD-10-CM | POA: Insufficient documentation

## 2015-12-12 DIAGNOSIS — I252 Old myocardial infarction: Secondary | ICD-10-CM | POA: Diagnosis not present

## 2015-12-12 DIAGNOSIS — M79622 Pain in left upper arm: Secondary | ICD-10-CM | POA: Diagnosis not present

## 2015-12-12 DIAGNOSIS — I2510 Atherosclerotic heart disease of native coronary artery without angina pectoris: Secondary | ICD-10-CM | POA: Insufficient documentation

## 2015-12-12 DIAGNOSIS — Z8719 Personal history of other diseases of the digestive system: Secondary | ICD-10-CM | POA: Insufficient documentation

## 2015-12-12 DIAGNOSIS — Z87891 Personal history of nicotine dependence: Secondary | ICD-10-CM | POA: Diagnosis not present

## 2015-12-12 DIAGNOSIS — C7951 Secondary malignant neoplasm of bone: Secondary | ICD-10-CM | POA: Insufficient documentation

## 2015-12-12 DIAGNOSIS — Z955 Presence of coronary angioplasty implant and graft: Secondary | ICD-10-CM | POA: Diagnosis not present

## 2015-12-12 DIAGNOSIS — M79602 Pain in left arm: Secondary | ICD-10-CM

## 2015-12-12 LAB — COMPREHENSIVE METABOLIC PANEL
ALBUMIN: 3.8 g/dL (ref 3.5–5.0)
ALT: 14 U/L — ABNORMAL LOW (ref 17–63)
ANION GAP: 8 (ref 5–15)
AST: 26 U/L (ref 15–41)
Alkaline Phosphatase: 57 U/L (ref 38–126)
BUN: 28 mg/dL — AB (ref 6–20)
CHLORIDE: 108 mmol/L (ref 101–111)
CO2: 21 mmol/L — ABNORMAL LOW (ref 22–32)
Calcium: 9.2 mg/dL (ref 8.9–10.3)
Creatinine, Ser: 1.21 mg/dL (ref 0.61–1.24)
GFR calc Af Amer: >60 mL/min (ref >60)
GFR calc non Af Amer: 58 mL/min — ABNORMAL LOW (ref >60)
GLUCOSE: 161 mg/dL — AB (ref 65–99)
POTASSIUM: 4.6 mmol/L (ref 3.5–5.1)
Sodium: 137 mmol/L (ref 135–145)
Total Bilirubin: 0.3 mg/dL (ref 0.3–1.2)
Total Protein: 6 g/dL — ABNORMAL LOW (ref 6.5–8.1)

## 2015-12-12 LAB — CBC WITH DIFFERENTIAL/PLATELET
BASOS ABS: 0 10*3/uL (ref 0–0.1)
BASOS PCT: 0 %
EOS ABS: 0 10*3/uL (ref 0–0.7)
EOS PCT: 1 %
HCT: 29.5 % — ABNORMAL LOW (ref 40.0–52.0)
Hemoglobin: 9.9 g/dL — ABNORMAL LOW (ref 13.0–18.0)
Lymphocytes Relative: 6 %
Lymphs Abs: 0.4 10*3/uL — ABNORMAL LOW (ref 1.0–3.6)
MCH: 32.1 pg (ref 26.0–34.0)
MCHC: 33.4 g/dL (ref 32.0–36.0)
MCV: 96 fL (ref 80.0–100.0)
MONO ABS: 0.3 10*3/uL (ref 0.2–1.0)
Monocytes Relative: 5 %
Neutro Abs: 6.8 10*3/uL — ABNORMAL HIGH (ref 1.4–6.5)
Neutrophils Relative %: 88 %
PLATELETS: 134 10*3/uL — AB (ref 150–440)
RBC: 3.07 MIL/uL — ABNORMAL LOW (ref 4.40–5.90)
RDW: 19.1 % — AB (ref 11.5–14.5)
WBC: 7.6 10*3/uL (ref 3.8–10.6)

## 2015-12-12 LAB — APTT: APTT: 30 s (ref 24–36)

## 2015-12-12 LAB — PROTIME-INR
INR: 1
PROTHROMBIN TIME: 13.4 s (ref 11.4–15.0)

## 2015-12-12 MED ORDER — MORPHINE SULFATE (PF) 4 MG/ML IV SOLN
4.0000 mg | Freq: Once | INTRAVENOUS | Status: AC
  Administered 2015-12-12: 4 mg via INTRAVENOUS
  Filled 2015-12-12: qty 1

## 2015-12-12 MED ORDER — ONDANSETRON HCL 4 MG/2ML IJ SOLN
4.0000 mg | INTRAMUSCULAR | Status: AC
  Administered 2015-12-12: 4 mg via INTRAVENOUS
  Filled 2015-12-12: qty 2

## 2015-12-12 NOTE — ED Provider Notes (Signed)
[facility] Emergency Department Provider Note  ____________________________________________  Time seen: Approximately 7:49 PM  I have reviewed the triage vital signs and the nursing notes.   HISTORY  Chief Complaint Arm Pain    HPI Derek Parker is a 75 y.o. male with stage IV prostate cancer currently followed by an oncologist at [facility] and radiation therapist at [facility] regional (Dr. Donella Stade).  He presents by private vehicle for evaluation of acute onset pain in his left upper extremity.  He reports that it started at the wrist and migrated up towards his elbow.  The pain was severe initially and then improved after taking tramadol and Aleve, but has since gotten worse again.  He denies any numbness or tingling in the extremity.  He denies fever/chills, chest pain, shortness of breath, nausea, vomiting, diarrhea.  He is chronically unstable on his feet and he says that his oncologist is concerned that the cancer may be affecting his balance.  He states that he had an MRI at [facility] last night of his spine but does not yet have the results.  He was unstable on his feet earlier today worse than usual, but he did not feel like he was going to pass out.   Past Medical History  Diagnosis Date  . Prostate cancer ([place])     metastatic to bone  . Hypercholesteremia   . CAD (coronary artery disease)   . HTN (hypertension)   . Myocardial infarction ([place])   . GERD (gastroesophageal reflux disease)   . Neuropathy [facility])     Patient Active Problem List   Diagnosis Date Noted  . Tachycardia 08/06/2015  . Protein-calorie malnutrition, severe 07/09/2015  . Prostate cancer metastatic to bone ([place])   . Pneumonia 07/08/2015  . Sinus tachycardia ([place]) 07/08/2015  . 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 Coronary artery bypass graft    . Umbilical hernia repair    . Cholecystectomy    . Knee surgery Left   .  Carotid stent    . Knee surgery Left 2006  . Hernia repair    . Cataract extraction    . Knee arthroplasty Left 05/31/2015    Procedure: COMPUTER ASSISTED TOTAL KNEE ARTHROPLASTY;  Surgeon: [name], MD;  Location: [facility] ORS;  Service: Orthopedics;  Laterality: Left;    Current Outpatient Rx  Name  Route  Sig  Dispense  Refill  . Calcium Carb-Cholecalciferol (CALCIUM 600+D) 600-800 MG-UNIT TABS   Oral   Take 1 tablet by mouth 2 (two) times daily.         . clopidogrel (PLAVIX) 75 MG tablet   Oral   Take 75 mg by mouth daily.         . Coenzyme Q10 (CO Q 10 PO)   Oral   Take 1 capsule by mouth daily.         Marland Kitchen dexlansoprazole (DEXILANT) 60 MG capsule   Oral   Take 60 mg by mouth daily.          . ferrous sulfate 325 (65 FE) MG tablet   Oral   Take 325 mg by mouth daily with breakfast.         . fludrocortisone (FLORINEF) 0.1 MG tablet   Oral   Take 0.1 mg by mouth daily.         . metoprolol (LOPRESSOR) 100 MG tablet   Oral   Take 1 tablet (100  mg total) by mouth 2 (two) times daily.   60 tablet   0   . Multiple Vitamin (MULTIVITAMIN WITH MINERALS) TABS tablet   Oral   Take 0.5 tablets by mouth 2 (two) times daily.         . naproxen sodium (ANAPROX) 220 MG tablet   Oral   Take 220 mg by mouth 2 (two) times daily as needed (for pain).         . niacin (NIASPAN) 500 MG CR tablet   Oral   Take 500-1,000 mg by mouth 2 (two) times daily. Pt takes one tablet in the morning and two in the evening.         Marland Kitchen oxyCODONE (OXY IR/ROXICODONE) 5 MG immediate release tablet   Oral   Take 1-2 tablets (5-10 mg total) by mouth every 4 (four) hours as needed for severe pain or breakthrough pain.   80 tablet   0   . potassium chloride SA (K-DUR,KLOR-CON) 20 MEQ tablet   Oral   Take 20 mEq by mouth 2 (two) times daily.         . rosuvastatin (CRESTOR) 20 MG tablet   Oral   Take 20 mg by mouth daily.         . solifenacin (VESICARE) 5 MG tablet    Oral   Take 5 mg by mouth daily.          . traMADol (ULTRAM) 50 MG tablet   Oral   Take 1-2 tablets (50-100 mg total) by mouth every 4 (four) hours as needed for moderate pain.   60 tablet   1   . venlafaxine XR (EFFEXOR-XR) 75 MG 24 hr capsule   Oral   Take 225 mg by mouth daily.            Allergies Review of patient's allergies indicates no known allergies.  Family History  Problem Relation Age of Onset  . Cancer Father     prostate    Social History Social History  Substance Use Topics  . Smoking status: Former Smoker -- 40 years  . Smokeless tobacco: None  . Alcohol Use: 0.6 oz/week    1 Shots of liquor per week    Review of Systems Constitutional: No fever/chills Eyes: No visual changes. ENT: No sore throat. Cardiovascular: Denies chest pain. Respiratory: Denies shortness of breath. Gastrointestinal: No abdominal pain.  No nausea, no vomiting.  No diarrhea.  No constipation. Genitourinary: Negative for dysuria. Musculoskeletal: Acute onset severe pain in LUE Skin: Negative for rash. Neurological: Negative for headaches, focal weakness or numbness.  10-point ROS otherwise negative.  ____________________________________________   PHYSICAL EXAM:  VITAL SIGNS: ED Triage Vitals  Enc Vitals Group     BP 12/12/15 1834 110/57 mmHg     Pulse Rate 12/12/15 1834 80     Resp 12/12/15 1834 16     Temp 12/12/15 1834 97.5 F (36.4 C)     Temp Source 12/12/15 1834 Oral     SpO2 12/12/15 1834 98 %     Weight 12/12/15 1834 194 lb (87.998 kg)     Height 12/12/15 1834 6' (1.829 m)     Head Cir --      Peak Flow --      Pain Score 12/12/15 1835 10     Pain Loc --      Pain Edu? --      Excl. in [place]? --     Constitutional: Alert and oriented. Well  appearing and in no acute distress. Eyes: Conjunctivae are normal. PERRL. EOMI. Head: Atraumatic. Nose: No congestion/rhinnorhea. Mouth/Throat: Mucous membranes are moist.  Oropharynx non-erythematous. Neck:  No stridor.  No meningeal signs.   Cardiovascular: Normal rate, regular rhythm. Good peripheral circulation. Grossly normal heart sounds.   Respiratory: Normal respiratory effort.  No retractions. Lungs CTAB. Gastrointestinal: Soft and nontender. No distention.  Musculoskeletal: No edema in the left upper extremity.  Both hands are well perfused, warm to the touch, with normal capillary refill.  He has equal and normal radial pulses in both hands and arms. Neurologic:  Normal speech and language. No gross focal neurologic deficits are appreciated.  Normal grip strength bilaterally, major muscle groups are normal in strength and equal. Skin:  Skin is warm, dry and intact. No rash noted. Psychiatric: Mood and affect are normal. Speech and behavior are normal.  ____________________________________________   LABS (all labs ordered are listed, but only abnormal results are displayed)  Labs Reviewed - No data to display ____________________________________________  EKG  ED ECG REPORT I, [place], the attending physician, personally viewed and interpreted this ECG.  Date: 12/12/2015 EKG Time: 18:35 Rate: 78 Rhythm: normal sinus rhythm QRS Axis: normal Intervals: normal ST/T Wave abnormalities: Inverted T-wave in lead 3, otherwise unremarkable Conduction Disturbances: none Narrative Interpretation: unremarkable  ____________________________________________  RADIOLOGY   No results found.  ____________________________________________   PROCEDURES  Procedure(s) performed:   Procedures   ____________________________________________   INITIAL IMPRESSION / ASSESSMENT AND PLAN / ED COURSE  Pertinent labs & imaging results that were available during my care of the patient were reviewed by me and considered in my medical decision making (see chart for details).  The patient has significant risk factors for DVT.  The acute onset and severity of the pain both suggest to me  that DVT is possible although there is no evidence of neurovascular compromise at this time.  Compartments are soft and easily compressible.  He has had no recent traumatic injury although he was evaluated for a fall about 2 weeks ago in this emergency department.  I will check basic labs and obtain an ultrasound of his left upper extremity looking for DVT.  I will also obtain 2 view chest x-ray to look for any mass or other gross abnormalities in the thorax.  Morphine and Zofran for pain and nausea (he had some nausea earlier with severe pain).  Patient and family agree with the plan.  21:00:  Transferring ED care to Dr. Kerman Passey to follow up on labs and ultrasound and reassess.  ____________________________________________  FINAL CLINICAL IMPRESSION(S) / ED DIAGNOSES  Left upper extremity pain Stage 4 prostate cancer on chemotherapy and radiation   MEDICATIONS GIVEN DURING THIS VISIT:  Medications - No data to display   NEW OUTPATIENT MEDICATIONS STARTED DURING THIS VISIT:  New Prescriptions   No medications on file      Note:  This document was prepared using Dragon voice recognition software and may include unintentional dictation errors.   Hinda Kehr, MD 12/12/15 2034

## 2015-12-12 NOTE — ED Notes (Signed)
MD at bedside. 

## 2015-12-12 NOTE — ED Provider Notes (Signed)
-----------------------------------------   11:02 PM on 12/12/2015 -----------------------------------------  Patient is also negative for DVT. Possible acute rib fracture on x-ray. Patient has pain medication to take at home. We'll discharge the patient home with oncology follow-up tomorrow. Patient agreeable to this plan. I discussed return precautions for any worsening pain, or fever.  Harvest Dark, MD 12/12/15 [phone]

## 2015-12-12 NOTE — ED Notes (Signed)
To [place] via stretcher.  AAOx3.  Skin warm and dry.  NAD.  Continue to monitor.

## 2015-12-12 NOTE — Discharge Instructions (Signed)

## 2015-12-12 NOTE — ED Notes (Addendum)
Pt reports left arm pain that started at wrist and has since moved to elbow since 1500. Pt with hx of cancer and is currently on plavix. Pt reports taking 2 aleve and 2 tramadol with no relief.

## 2016-03-16 ENCOUNTER — Encounter: Payer: Self-pay | Admitting: *Deleted

## 2016-03-29 ENCOUNTER — Emergency Department
Admission: EM | Admit: 2016-03-29 | Discharge: 2016-03-29 | Disposition: A | Discharge status: Home / Self Care | Payer: Medicare Other | Attending: Emergency Medicine | Admitting: Emergency Medicine

## 2016-03-29 ENCOUNTER — Emergency Department: Payer: Medicare Other

## 2016-03-29 DIAGNOSIS — M79602 Pain in left arm: Secondary | ICD-10-CM | POA: Diagnosis present

## 2016-03-29 DIAGNOSIS — I10 Essential (primary) hypertension: Secondary | ICD-10-CM | POA: Diagnosis not present

## 2016-03-29 DIAGNOSIS — Z951 Presence of aortocoronary bypass graft: Secondary | ICD-10-CM | POA: Diagnosis not present

## 2016-03-29 DIAGNOSIS — Z8546 Personal history of malignant neoplasm of prostate: Secondary | ICD-10-CM | POA: Diagnosis not present

## 2016-03-29 DIAGNOSIS — Z87891 Personal history of nicotine dependence: Secondary | ICD-10-CM | POA: Diagnosis not present

## 2016-03-29 DIAGNOSIS — I2510 Atherosclerotic heart disease of native coronary artery without angina pectoris: Secondary | ICD-10-CM | POA: Diagnosis not present

## 2016-03-29 LAB — BASIC METABOLIC PANEL
ANION GAP: 10 (ref 5–15)
BUN: 36 mg/dL — ABNORMAL HIGH (ref 6–20)
CALCIUM: 9.8 mg/dL (ref 8.9–10.3)
CO2: 19 mmol/L — AB (ref 22–32)
CREATININE: 1.08 mg/dL (ref 0.61–1.24)
Chloride: 105 mmol/L (ref 101–111)
GFR calc non Af Amer: >60 mL/min (ref >60)
Glucose, Bld: 163 mg/dL — ABNORMAL HIGH (ref 65–99)
Potassium: 5.1 mmol/L (ref 3.5–5.1)
SODIUM: 134 mmol/L — AB (ref 135–145)

## 2016-03-29 LAB — CBC
HEMATOCRIT: 29.9 % — AB (ref 40.0–52.0)
HEMOGLOBIN: 10.5 g/dL — AB (ref 13.0–18.0)
MCH: 31.6 pg (ref 26.0–34.0)
MCHC: 35.1 g/dL (ref 32.0–36.0)
MCV: 90 fL (ref 80.0–100.0)
Platelets: 169 10*3/uL (ref 150–440)
RBC: 3.32 MIL/uL — ABNORMAL LOW (ref 4.40–5.90)
RDW: 20.5 % — AB (ref 11.5–14.5)
WBC: 5.9 10*3/uL (ref 3.8–10.6)

## 2016-03-29 LAB — TROPONIN I

## 2016-03-29 LAB — LACTIC ACID, PLASMA: LACTIC ACID, VENOUS: 1.3 mmol/L (ref 0.5–1.9)

## 2016-03-29 MED ORDER — FENTANYL CITRATE (PF) 100 MCG/2ML IJ SOLN
50.0000 ug | Freq: Once | INTRAMUSCULAR | Status: AC
Start: 2016-03-29 — End: 2016-03-29
  Administered 2016-03-29: 50 ug via INTRAVENOUS

## 2016-03-29 MED ORDER — SODIUM CHLORIDE 0.9 % IV BOLUS (SEPSIS)
1000.0000 mL | Freq: Once | INTRAVENOUS | Status: AC
  Administered 2016-03-29: 1000 mL via INTRAVENOUS

## 2016-03-29 MED ORDER — MORPHINE SULFATE (PF) 4 MG/ML IV SOLN
4.0000 mg | Freq: Once | INTRAVENOUS | Status: AC
  Administered 2016-03-29: 4 mg via INTRAVENOUS

## 2016-03-29 MED ORDER — MORPHINE SULFATE (PF) 4 MG/ML IV SOLN
INTRAVENOUS | Status: AC
  Administered 2016-03-29: 4 mg via INTRAVENOUS
  Filled 2016-03-29: qty 1

## 2016-03-29 MED ORDER — FENTANYL CITRATE (PF) 100 MCG/2ML IJ SOLN
INTRAMUSCULAR | Status: AC
  Administered 2016-03-29: 50 ug via INTRAVENOUS
  Filled 2016-03-29: qty 2

## 2016-03-29 NOTE — ED Notes (Signed)
Pt not in room, pt at ultrasound. Wife given warm blanket.

## 2016-03-29 NOTE — ED Triage Notes (Addendum)
Pt to triage via w/c with no distress noted, mask in place, pale in color; pt reports onset left wrist pain at 9pm, now into forearm ; denies any accomp symptoms; st hx of same with no dx; prostate CA with metastasis

## 2016-03-29 NOTE — ED Provider Notes (Signed)
[facility] Emergency Department Provider Note   ____________________________________________   First MD Initiated Contact with Patient 03/29/16 0034     (approximate)  I have reviewed the triage vital signs and the nursing notes.   HISTORY  Chief Complaint Arm Pain    HPI Derek Parker is a 75 y.o. male who comes into the hospital today with severe pain in his left arm. The patient reports it started in his wrist but is now on his forearm. This pain started around 9 PM. The patient had a similar episode in his right arm back in June but when he came into the hospital we couldn't figure out what was going on. The patient received pain medication and the pain had resolved. The patient reports his pain is a 10 out of 10 in intensity currently. He denies any headache and denies blurred vision. The patient is nauseous but reports that he did take some medication for that at home. The patient denies any chest pain, abdominal pain, weakness, numbness, tingling. The patient has had some mild shortness of breath. He has a history of metastatic prostate cancer with metastases to his spine and many of his bones. He is currently taking Keytruda for treatment.   Past Medical History:  Diagnosis Date  . CAD (coronary artery disease)   . GERD (gastroesophageal reflux disease)   . HTN (hypertension)   . Hypercholesteremia   . Myocardial infarction   . Neuropathy ([place])   . Prostate cancer [facility])    metastatic to bone    Patient Active Problem List   Diagnosis Date Noted  . Tachycardia 08/06/2015  . Protein-calorie malnutrition, severe 07/09/2015  . Prostate cancer metastatic to bone ([place])   . Pneumonia 07/08/2015  . Sinus tachycardia 07/08/2015  . 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 CAROTID STENT    . CATARACT EXTRACTION    . CHOLECYSTECTOMY    . CORONARY ARTERY BYPASS GRAFT    . HERNIA REPAIR      . KNEE ARTHROPLASTY Left 05/31/2015   Procedure: COMPUTER ASSISTED TOTAL KNEE ARTHROPLASTY;  Surgeon: [name], MD;  Location: [facility] ORS;  Service: Orthopedics;  Laterality: Left;  . KNEE SURGERY Left   . KNEE SURGERY Left 2006  . UMBILICAL HERNIA REPAIR      Prior to Admission medications   Medication Sig Start Date End Date Taking? Authorizing Provider  alfuzosin (UROXATRAL) 10 MG 24 hr tablet Take 10 mg by mouth every evening.    Historical Provider, MD  Calcium Carb-Cholecalciferol (CALCIUM 600+D) 600-800 MG-UNIT TABS Take 1 tablet by mouth 2 (two) times daily.    Historical Provider, MD  clopidogrel (PLAVIX) 75 MG tablet Take 75 mg by mouth daily.    Historical Provider, MD  Coenzyme Q10 (CO Q 10 PO) Take 1 capsule by mouth daily.    Historical Provider, MD  dexlansoprazole (DEXILANT) 60 MG capsule Take 60 mg by mouth daily.     Historical Provider, MD  ferrous sulfate 325 (65 FE) MG tablet Take 325 mg by mouth daily with breakfast.    Historical Provider, MD  metoprolol (LOPRESSOR) 100 MG tablet Take 1 tablet (100 mg total) by mouth 2 (two) times daily. Patient not taking: Reported on 12/12/2015 08/08/15   Dustin Flock, MD  metoprolol (LOPRESSOR) 50 MG tablet Take 50 mg by mouth 2 (two) times daily.    Historical Provider, MD  Multiple Vitamin (MULTIVITAMIN WITH  MINERALS) TABS tablet Take 0.5 tablets by mouth 2 (two) times daily.    Historical Provider, MD  naproxen sodium (ANAPROX) 220 MG tablet Take 220 mg by mouth 2 (two) times daily as needed (for pain).    Historical Provider, MD  niacin (NIASPAN) 500 MG CR tablet Take 500-1,000 mg by mouth 2 (two) times daily. Pt takes one tablet in the morning and two in the evening.    Historical Provider, MD  oxyCODONE (OXY IR/ROXICODONE) 5 MG immediate release tablet Take 1-2 tablets (5-10 mg total) by mouth every 4 (four) hours as needed for severe pain or breakthrough pain. 06/01/15   Watt Climes, PA  oxyCODONE-acetaminophen  (PERCOCET/ROXICET) 5-325 MG tablet Take 1-2 tablets by mouth every 6 (six) hours as needed for severe pain.    Historical Provider, MD  potassium chloride SA (K-DUR,KLOR-CON) 20 MEQ tablet Take 20 mEq by mouth 2 (two) times daily.    Historical Provider, MD  prochlorperazine (COMPAZINE) 10 MG tablet Take 10 mg by mouth every 6 (six) hours as needed.    Historical Provider, MD  rosuvastatin (CRESTOR) 20 MG tablet Take 20 mg by mouth daily.    Historical Provider, MD  solifenacin (VESICARE) 5 MG tablet Take 5 mg by mouth daily.     Historical Provider, MD  traMADol (ULTRAM) 50 MG tablet Take 1-2 tablets (50-100 mg total) by mouth every 4 (four) hours as needed for moderate pain. 06/01/15   Watt Climes, PA  venlafaxine XR (EFFEXOR-XR) 75 MG 24 hr capsule Take 225 mg by mouth daily.     Historical Provider, MD    Allergies Review of patient's allergies indicates no known allergies.  Family History  Problem Relation Age of Onset  . Cancer Father     prostate    Social History Social History  Substance Use Topics  . Smoking status: Former Smoker    Years: 40.00  . Smokeless tobacco: Not on file  . Alcohol use 0.6 oz/week    1 Shots of liquor per week    Review of Systems Constitutional: No fever/chills Eyes: No visual changes. ENT: No sore throat. Cardiovascular: Denies chest pain. Respiratory: Denies shortness of breath. Gastrointestinal: No abdominal pain.  No nausea, no vomiting.  No diarrhea.  No constipation. Genitourinary: Negative for dysuria. Musculoskeletal: arm pain Skin: Negative for rash. Neurological: Negative for headaches, focal weakness or numbness.  10-point ROS otherwise negative.  ____________________________________________   PHYSICAL EXAM:  VITAL SIGNS: ED Triage Vitals  Enc Vitals Group     BP 03/29/16 0026 (!) 68/39     Pulse Rate 03/29/16 0026 78     Resp 03/29/16 0026 18     Temp 03/29/16 0026 98 F (36.7 C)     Temp Source 03/29/16 0026 Oral       SpO2 03/29/16 0026 99 %     Weight 03/29/16 0024 186 lb (84.4 kg)     Height 03/29/16 0024 6' (1.829 m)     Head Circumference --      Peak Flow --      Pain Score 03/29/16 0024 10     Pain Loc --      Pain Edu? --      Excl. in [place]? --     Constitutional: Alert and oriented. Well appearing and in mild distress. Eyes: Conjunctivae are normal. PERRL. EOMI. Head: Atraumatic. Nose: No congestion/rhinnorhea. Mouth/Throat: Mucous membranes are moist.  Oropharynx non-erythematous. Cardiovascular: Normal rate, regular rhythm. Grossly normal heart sounds.  Good peripheral circulation. Respiratory: Normal respiratory effort.  No retractions. Lungs CTAB. Gastrointestinal: Soft and nontender. No distention. Positive bowel sounds Musculoskeletal: No lower extremity tenderness nor edema.  No arm tenderness to palpation Neurologic:  Normal speech and language. No gross focal neurologic deficits are appreciated. No gait instability. Skin:  Skin is warm, dry and intact. Pale color Psychiatric: Mood and affect are normal.   ____________________________________________   LABS (all labs ordered are listed, but only abnormal results are displayed)  Labs Reviewed  CBC - Abnormal; Notable for the following:       Result Value   RBC 3.32 (*)    Hemoglobin 10.5 (*)    HCT 29.9 (*)    RDW 20.5 (*)    All other components within normal limits  BASIC METABOLIC PANEL - Abnormal; Notable for the following:    Sodium 134 (*)    CO2 19 (*)    Glucose, Bld 163 (*)    BUN 36 (*)    All other components within normal limits  TROPONIN I  LACTIC ACID, PLASMA  TROPONIN I   ____________________________________________  EKG  ED ECG REPORT I, Loney Hering, the attending physician, personally viewed and interpreted this ECG.   Date: 03/29/2016  EKG Time: 0030  Rate: 73  Rhythm: normal sinus rhythm  Axis: normal  Intervals:none  ST&T Change:  none  ____________________________________________  RADIOLOGY  Left arm xray Left arm doppler ____________________________________________   PROCEDURES  Procedure(s) performed: None  Procedures  Critical Care performed: No  ____________________________________________   INITIAL IMPRESSION / ASSESSMENT AND PLAN / ED COURSE  Pertinent labs & imaging results that were available during my care of the patient were reviewed by me and considered in my medical decision making (see chart for details).  This is a 75 year old man who comes into the hospital today with some arm pain. The patient reports that the pain started and it was very uncomfortable. The patient does have metastatic prostate cancer so my concern is could he have a bony metastases to his arm. When the patient initially arrived in the emergency department his blood pressure was registering at 60s over 30s. He was rushed straight back. His blood pressure was in 80s over 70s. He was started on a liter of normal saline and his blood pressure came up to 123/63. I will check some blood work to include a lactic acid. I will check some imaging and the patient will be reassessed. Once the blood pressure was improved to give the patient a dose of fentanyl 50 g IV 1 dose.  Clinical Course  Value Comment By Time  US Venous Img Upper Uni Left No evidence of deep venous thrombosis. Loney Hering, MD 10/04 [phone]  DG Forearm Left Negative. Loney Hering, MD 10/04 [phone]   The patient's blood work is unremarkable including a repeat troponin and his imaging is negative. The patient did have an initial blood pressure that was in the 60s in triage but when he arrived back immediately it was all within normal limits. The patient did receive normal Margaretha Sheffield but did not have any further episodes of hypotension. I will discharge the patient home and have him follow-up with his primary care physician. The patient and his wife agree with this  plan as stated.  ____________________________________________   FINAL CLINICAL IMPRESSION(S) / ED DIAGNOSES  Final diagnoses:  Left arm pain      NEW MEDICATIONS STARTED DURING THIS VISIT:  Discharge Medication List as  of 03/29/2016  5:43 AM       Note:  This document was prepared using Dragon voice recognition software and may include unintentional dictation errors.    Loney Hering, MD 03/29/16 [phone]

## 2016-04-17 ENCOUNTER — Ambulatory Visit: Payer: Medicare Other | Admitting: Radiation Oncology

## 2016-04-21 ENCOUNTER — Ambulatory Visit: Payer: Medicare Other | Admitting: Radiation Oncology

## 2016-05-26 DEATH — deceased

## 2016-07-28 IMAGING — CR DG KNEE 1-2V PORT*L*
1 series · 2 of 2 positions shown · non-contrast
Comparison: MRI 05/14/2009

CLINICAL DATA: Postop left knee replacement

EXAM:
PORTABLE LEFT KNEE - 1-2 VIEW

[Series 1: ap · 0.17mm/px · 2 of 2 slices shown]
[im 1/2]
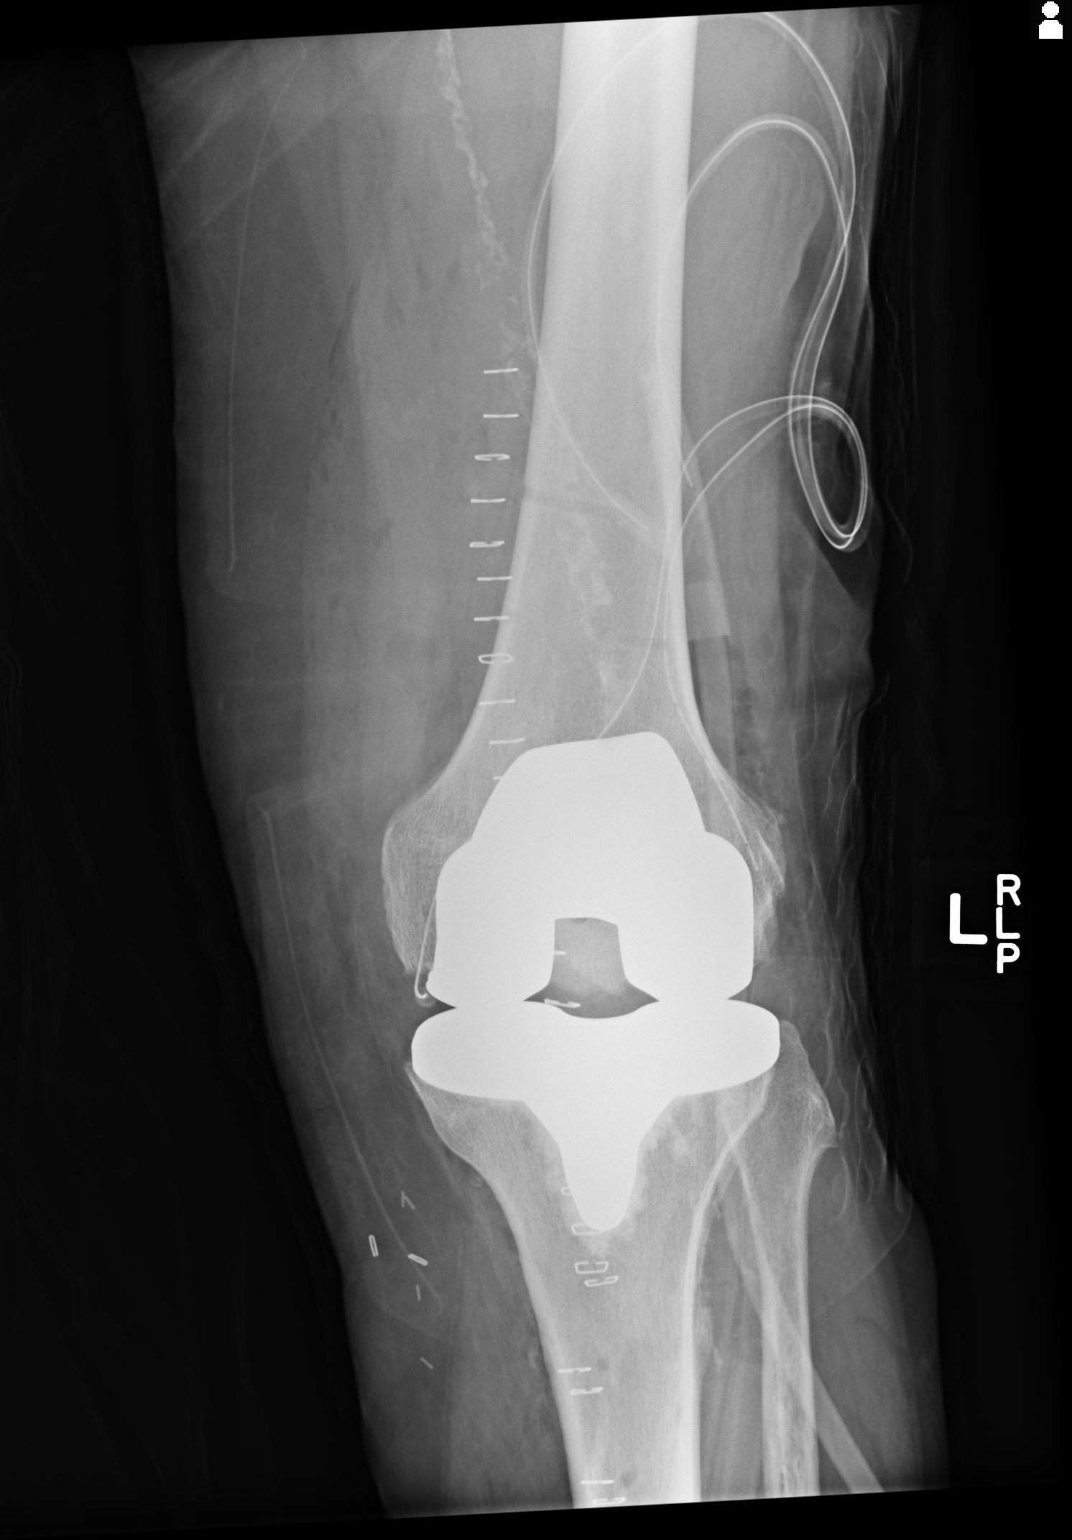
[im 2/2]
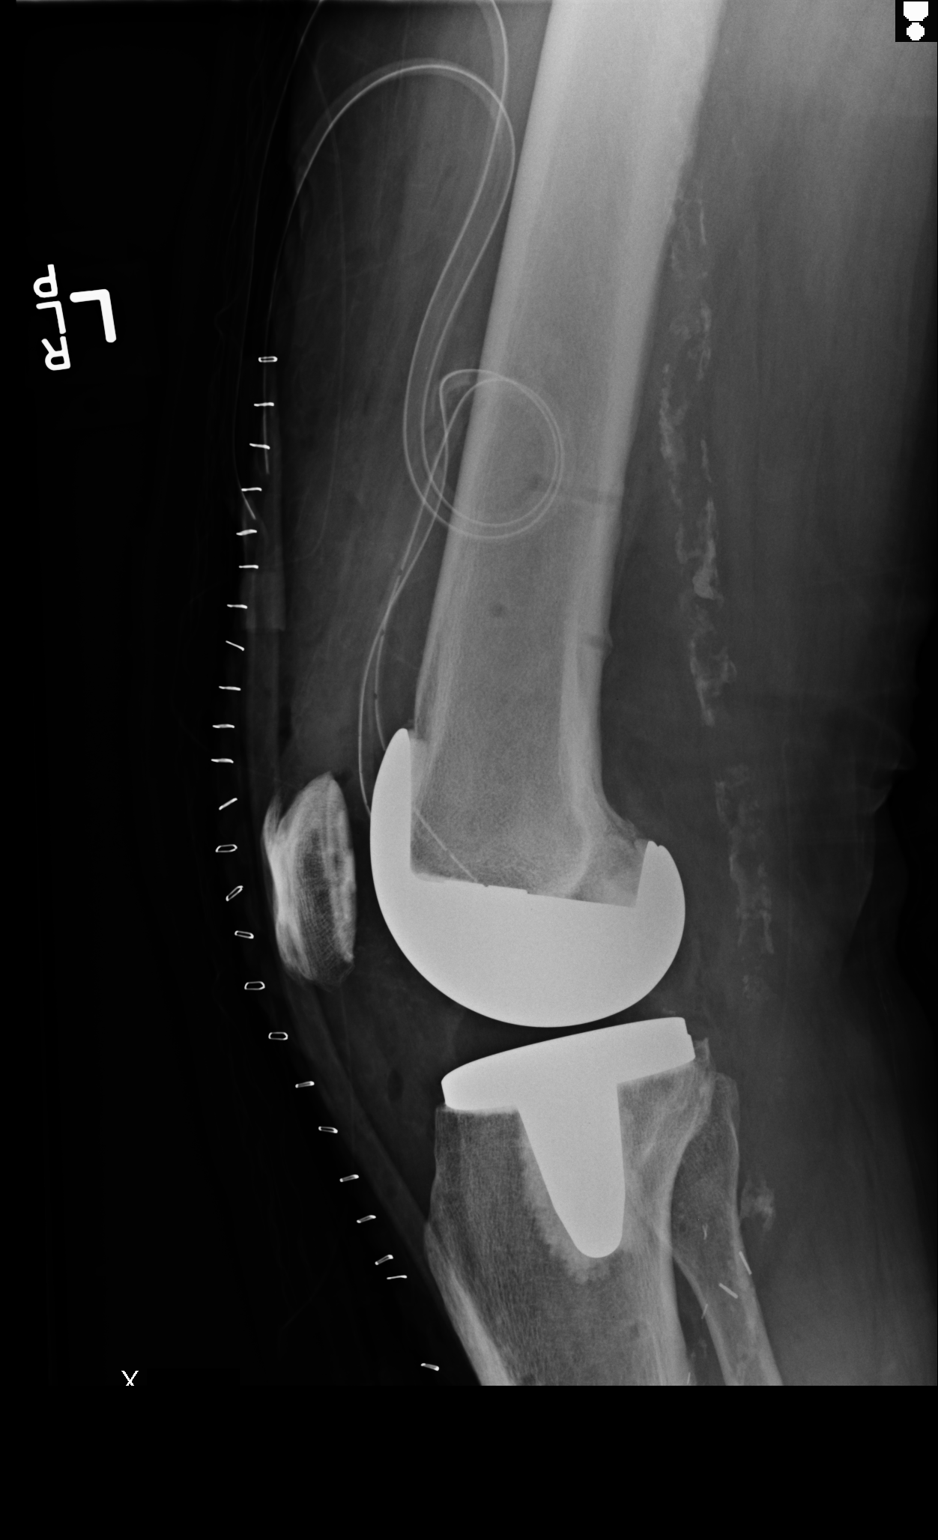

[2 of 2 positions shown; findings below may reference images not displayed]

FINDINGS: Total knee arthroplasty without periprosthetic fracture or
dislocation. Recent surgery with skin staples, drain, and soft
tissue gas. Extensive atherosclerosis.
IMPRESSION: No acute finding after total knee arthroplasty.

## 2016-09-04 IMAGING — CR DG CHEST 1V
1 series · 1 of 1 positions shown · non-contrast
Comparison: 10/30/2014

CLINICAL DATA: Shortness of breath, tachycardia

EXAM:
CHEST 1 VIEW

[ap]
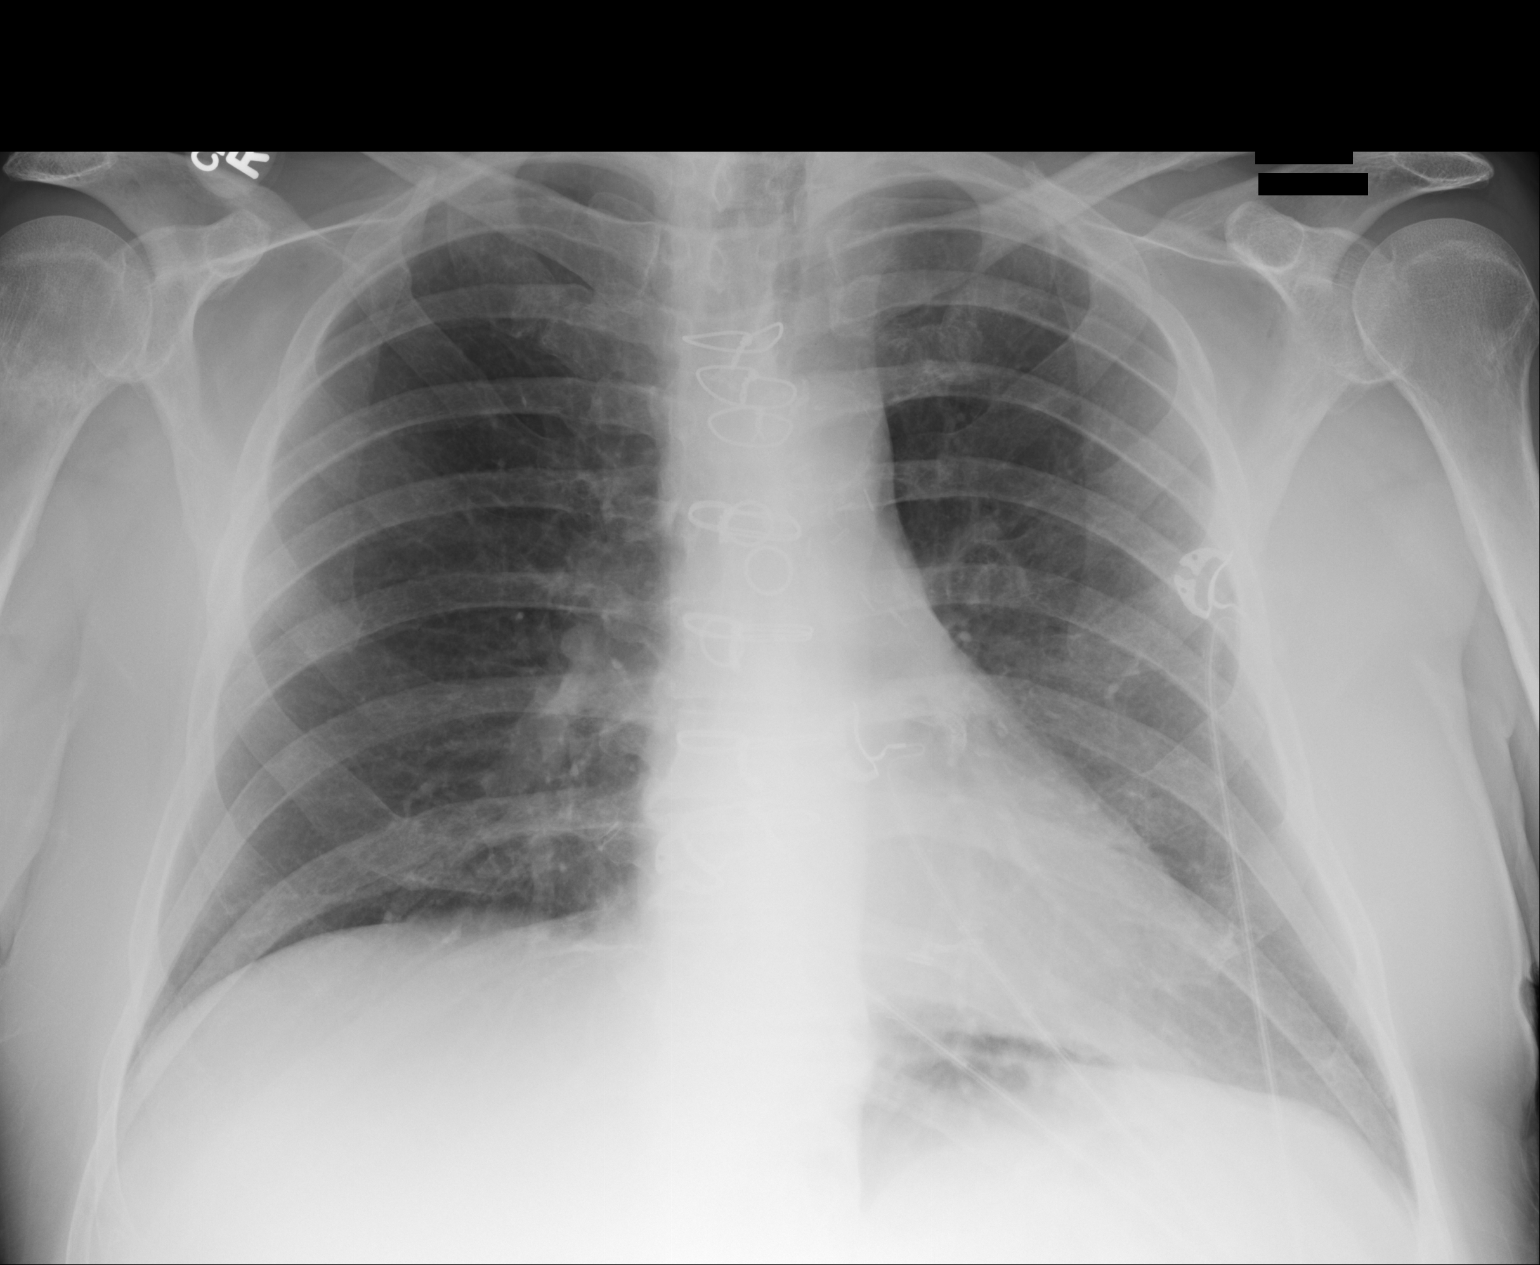

[1 of 1 positions shown; findings below may reference images not displayed]

FINDINGS: Cardiomediastinal silhouette is stable. Status post CABG. No acute
infiltrate or pleural effusion. No pulmonary edema. Chronic right AC
joint separation is stable.
IMPRESSION: No active disease.

## 2017-02-08 IMAGING — CR DG CHEST 2V
2 series · 2 of 2 positions shown · non-contrast
Comparison: Chest CT dated 07/08/2015

CLINICAL DATA: 75-year-old male with acute onset pain in the left
upper extremity. History of metastatic prostate cancer.

EXAM:
CHEST  2 VIEW

[chest pa]
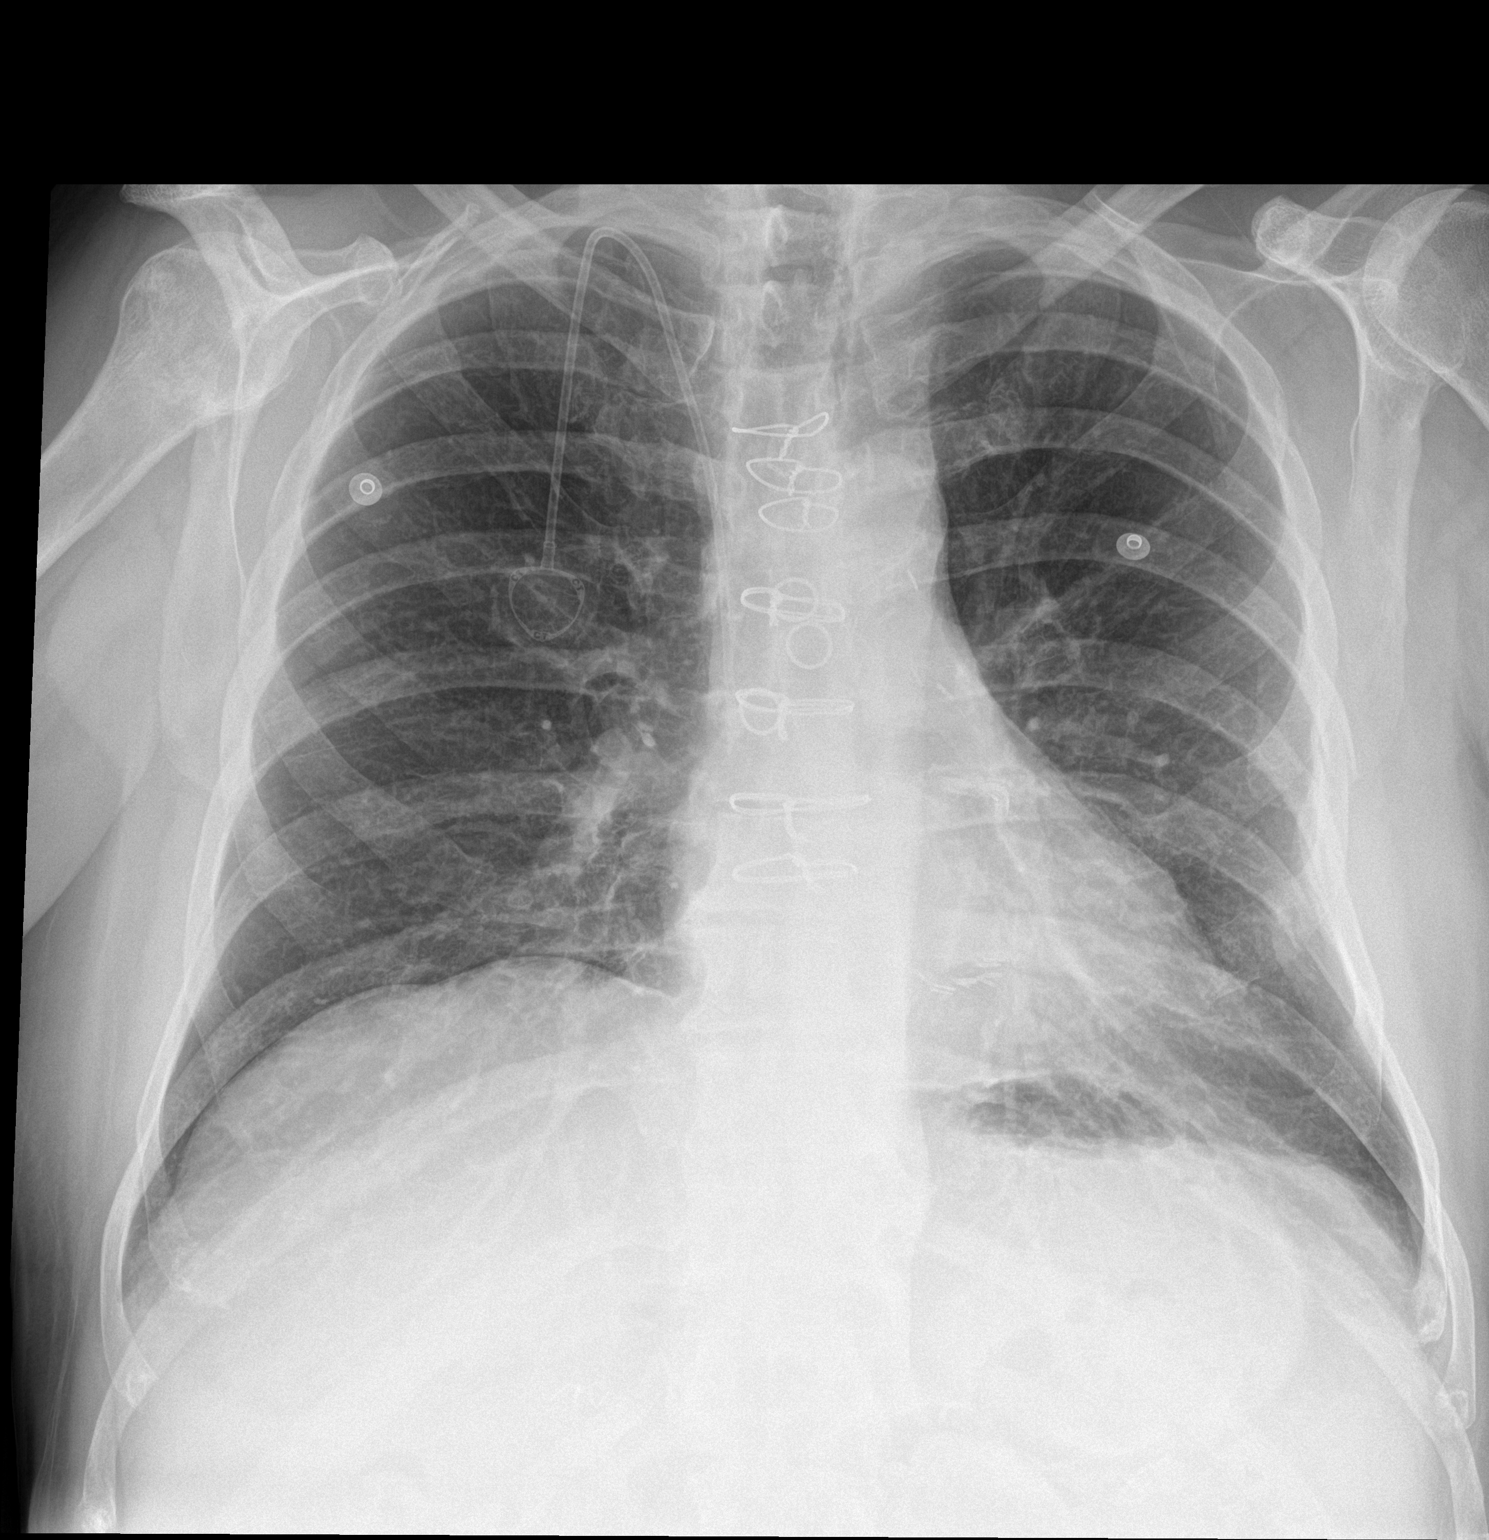

[chest lat]
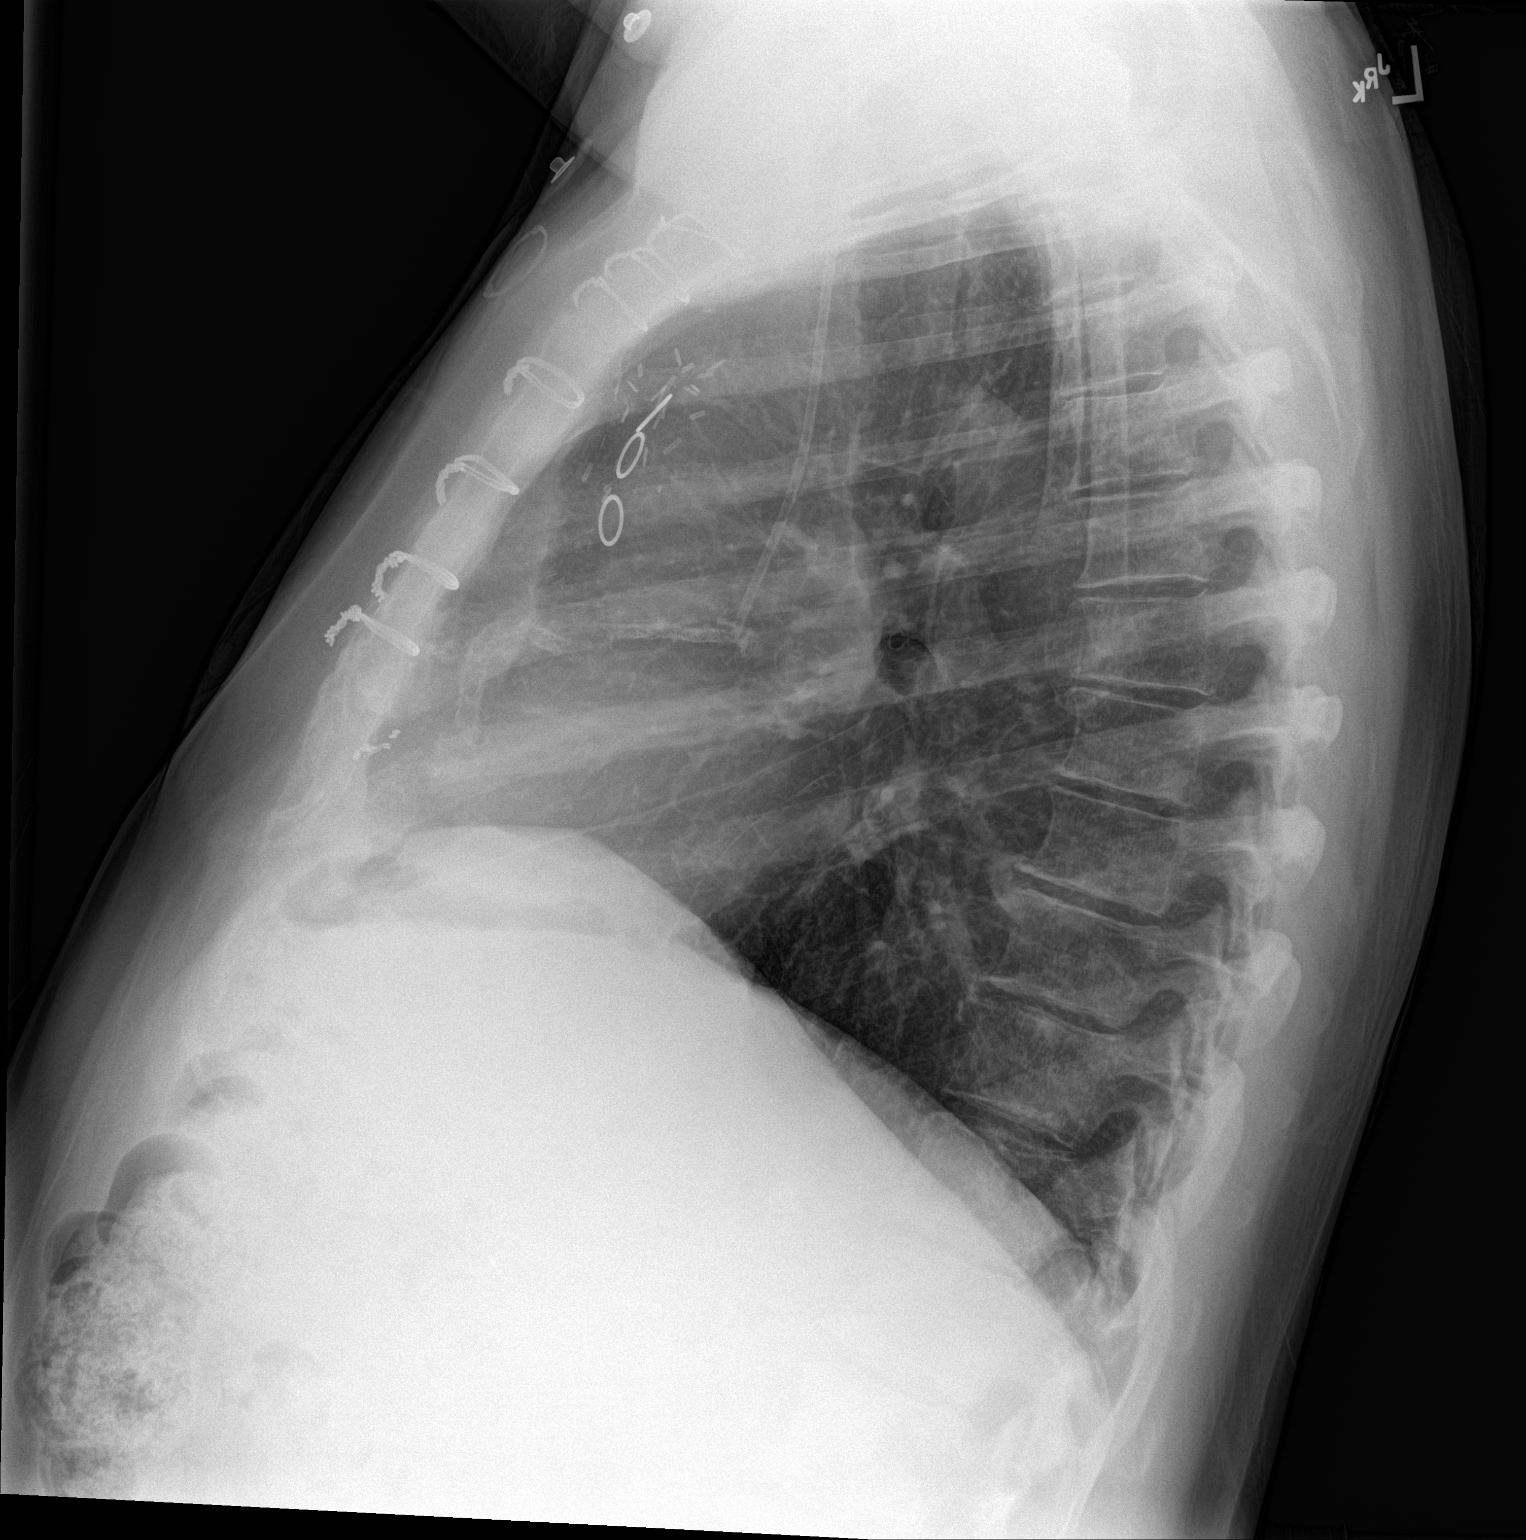

[2 of 2 positions shown; findings below may reference images not displayed]

FINDINGS: Two views of the chest demonstrate subsegmental atelectatic changes
of the left lung base. There is no focal consolidation, pleural
effusion, or pneumothorax. The cardiac silhouette is within normal
limits. Median sternotomy wires and CABG vascular clips and stents
noted. Right pectoral Port-A-Cath with tip at the cavoatrial
junction. There is a minimally displaced fracture of the left
lateral sixth rib, which is new from prior study, likely acute.
Clinical correlation is recommended. A nondisplaced fracture of the
posterior aspect of the left eighth rib may be present. There is
sclerotic changes of the right shoulder and scapula concerning for
metastatic disease.
IMPRESSION: Left lateral sixth rib fracture, likely acute. Clinical correlation
is recommended. No pneumothorax.

## 2018-02-23 IMAGING — US US EXTREM  UP VENOUS*L*
1 series · 13 of 24 positions shown · non-contrast
Comparison: None.

CLINICAL DATA: Acute onset of left arm pain.  Initial encounter.



[Series 1: us extrem up venous*left* · 0.08mm/px · 13 of 35 slices shown]
[im 1/35]
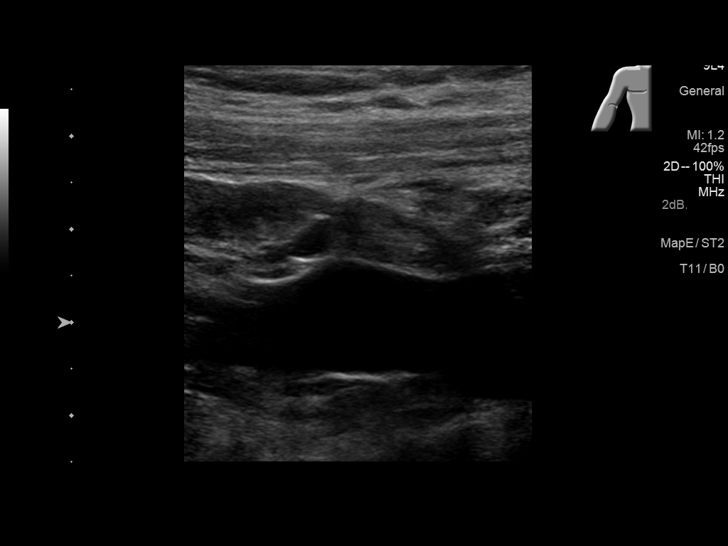
[im 3/35]
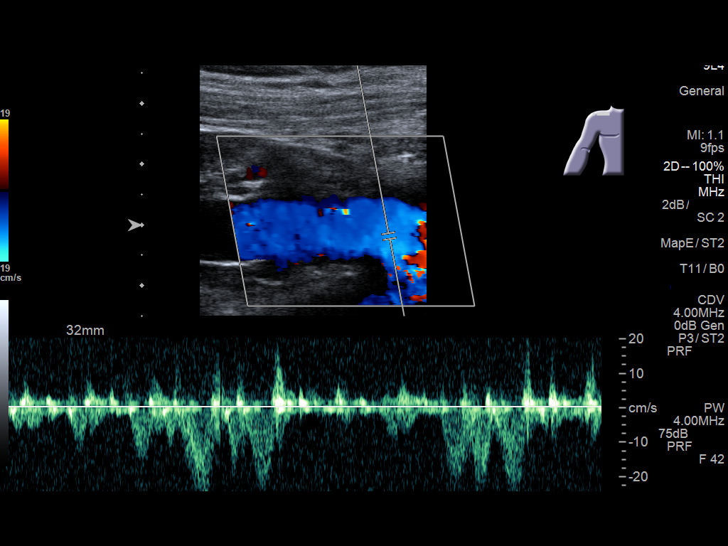
[im 6/35]
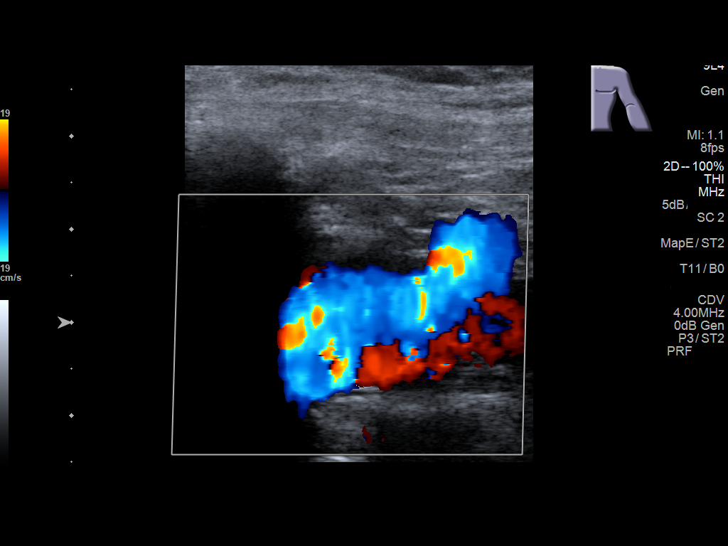
[im 9/35]
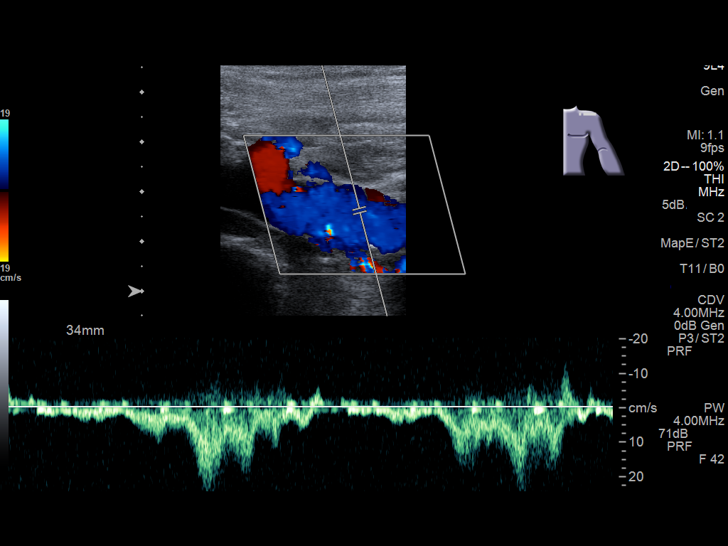
[im 12/35]
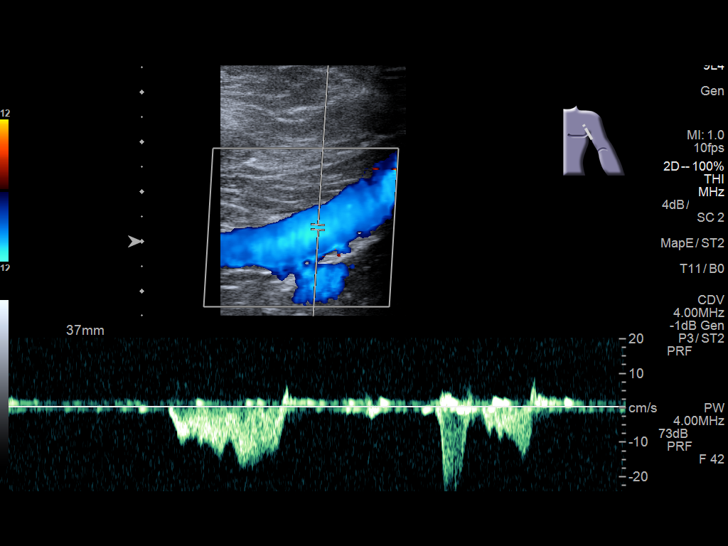
[im 15/35]
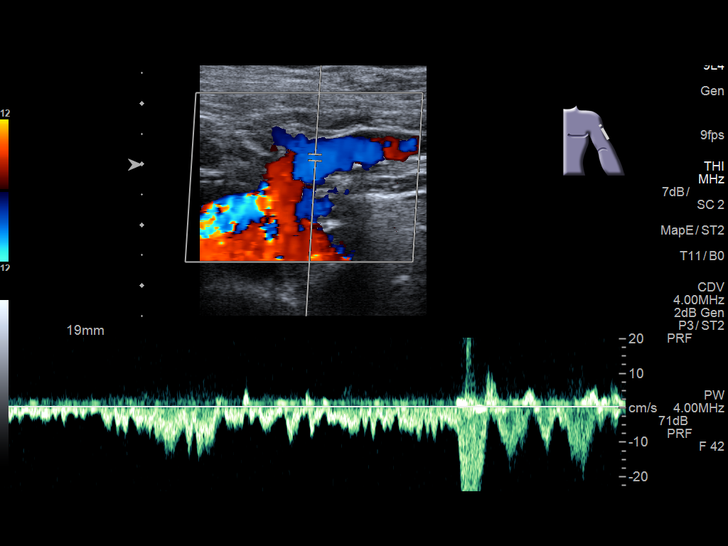
[im 18/35]
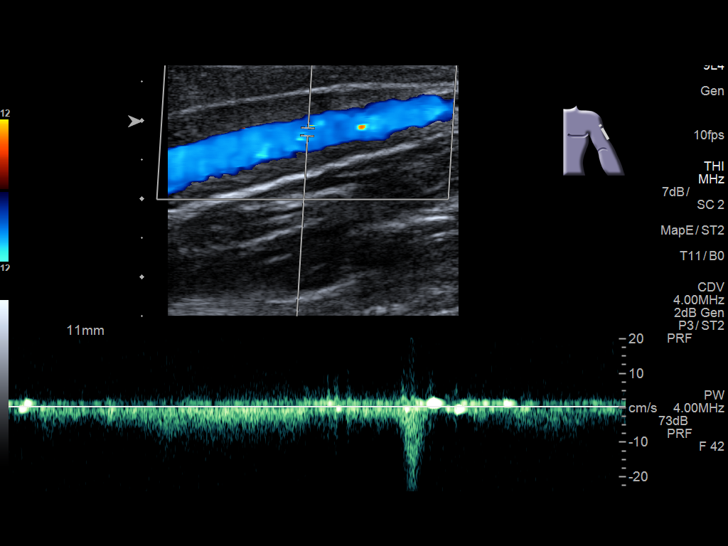
[im 20/35]
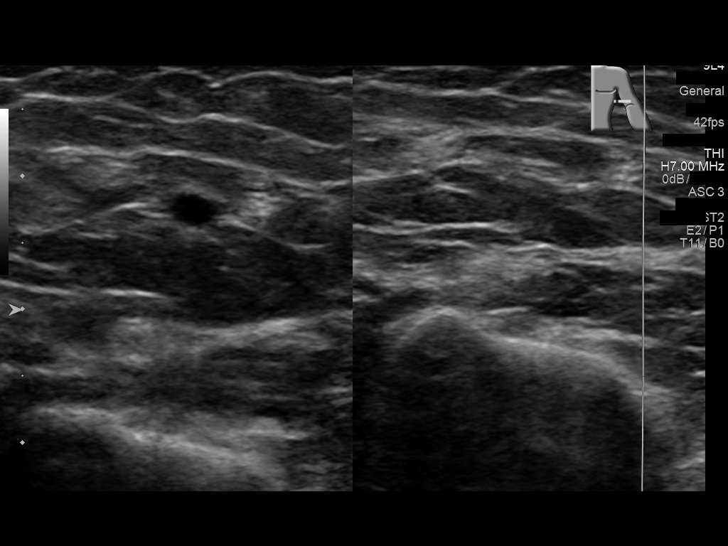
[im 23/35]
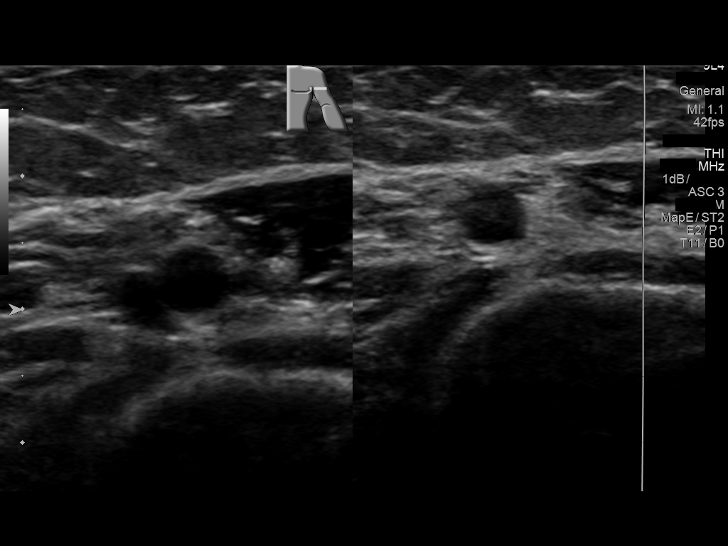
[im 26/35]
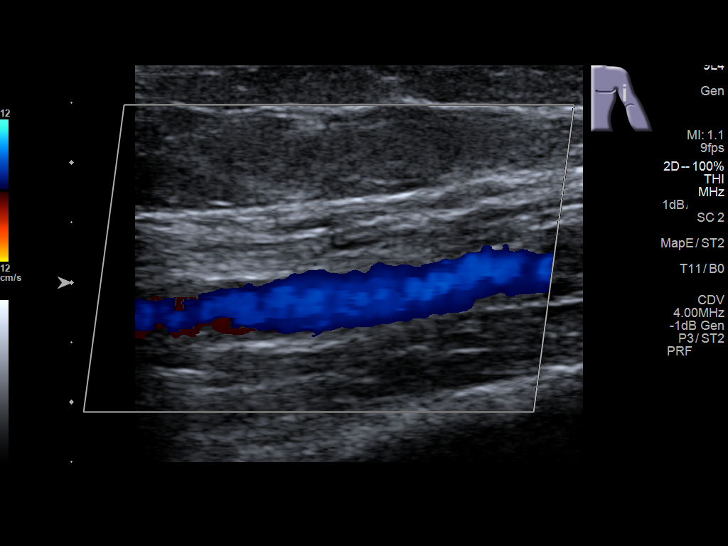
[im 29/35]
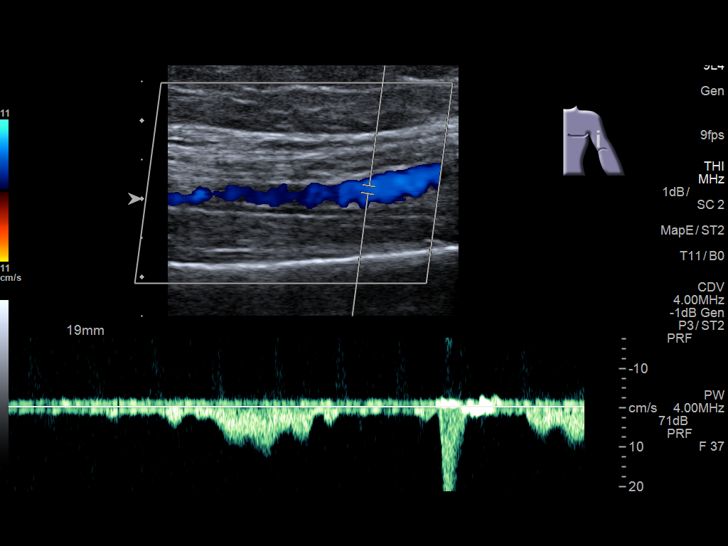
[im 32/35]
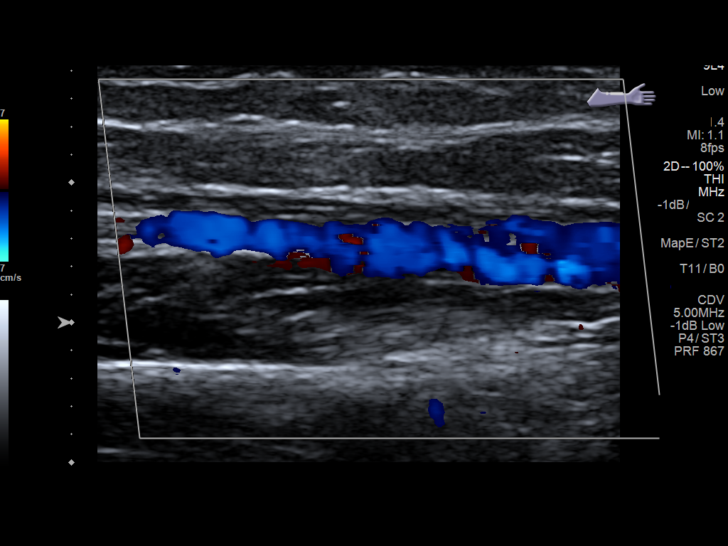
[im 35/35]
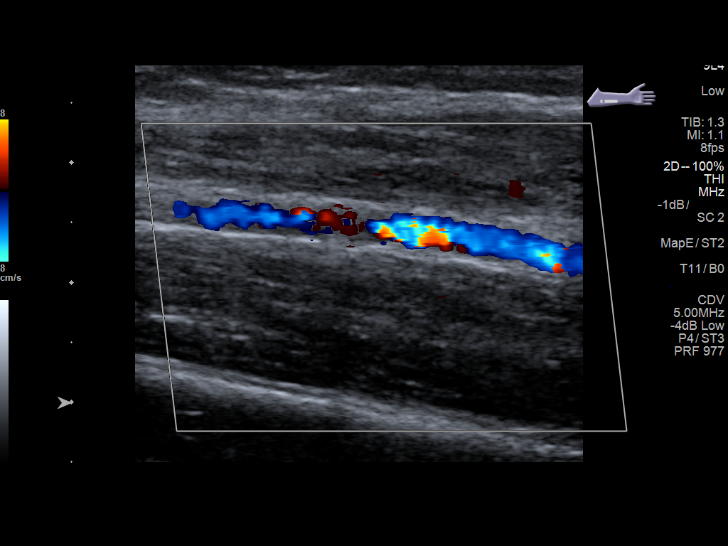

[13 of 24 positions shown; findings below may reference images not displayed]

FINDINGS: Contralateral Subclavian Vein: Respiratory phasicity is normal and
symmetric with the symptomatic side. No evidence of thrombus. Normal
compressibility.

Internal Jugular Vein: No evidence of thrombus. Normal
compressibility, respiratory phasicity and response to augmentation.

Subclavian Vein: No evidence of thrombus. Normal compressibility,
respiratory phasicity and response to augmentation.

Axillary Vein: No evidence of thrombus. Normal compressibility,
respiratory phasicity and response to augmentation.

Cephalic Vein: No evidence of thrombus. Normal compressibility,
respiratory phasicity and response to augmentation.

Basilic Vein: No evidence of thrombus. Normal compressibility,
respiratory phasicity and response to augmentation.

Brachial Veins: No evidence of thrombus. Normal compressibility,
respiratory phasicity and response to augmentation.

Radial Veins: No evidence of thrombus. Normal compressibility,
respiratory phasicity and response to augmentation.

Ulnar Veins: No evidence of thrombus. Normal compressibility,
respiratory phasicity and response to augmentation.

Venous Reflux:  None visualized.

Other Findings:  None visualized.
IMPRESSION: No evidence of deep venous thrombosis.
